# Patient Record
Sex: Female | Born: 1945
Health system: Southern US, Community
[De-identification: ages and names within clinical notes are randomized; demographics above are authoritative.]

## PROBLEM LIST (undated history)

## (undated) DIAGNOSIS — I10 Essential (primary) hypertension: Secondary | ICD-10-CM

## (undated) DIAGNOSIS — N819 Female genital prolapse, unspecified: Secondary | ICD-10-CM

## (undated) DIAGNOSIS — Z87442 Personal history of urinary calculi: Secondary | ICD-10-CM

## (undated) DIAGNOSIS — E7800 Pure hypercholesterolemia, unspecified: Secondary | ICD-10-CM

## (undated) DIAGNOSIS — N201 Calculus of ureter: Secondary | ICD-10-CM

## (undated) DIAGNOSIS — E119 Type 2 diabetes mellitus without complications: Secondary | ICD-10-CM

## (undated) DIAGNOSIS — J309 Allergic rhinitis, unspecified: Secondary | ICD-10-CM

## (undated) HISTORY — DX: Type 2 diabetes mellitus without complications: E11.9

## (undated) HISTORY — PX: TONSILLECTOMY: SUR1361

## (undated) HISTORY — PX: TUBAL LIGATION: SHX77

## (undated) HISTORY — DX: Allergic rhinitis, unspecified: J30.9

## (undated) HISTORY — DX: Pure hypercholesterolemia, unspecified: E78.00

## (undated) HISTORY — PX: CATARACT EXTRACTION: SUR2

## (undated) HISTORY — DX: Essential (primary) hypertension: I10

## (undated) HISTORY — PX: ADENOIDECTOMY: SUR15

---

## 1999-09-13 ENCOUNTER — Encounter: Payer: Self-pay | Admitting: Family Medicine

## 1999-09-13 ENCOUNTER — Encounter: Admission: RE | Admit: 1999-09-13 | Discharge: 1999-09-13 | Payer: Self-pay | Admitting: Family Medicine

## 2000-11-28 ENCOUNTER — Encounter: Payer: Self-pay | Admitting: Family Medicine

## 2000-11-28 ENCOUNTER — Encounter: Admission: RE | Admit: 2000-11-28 | Discharge: 2000-11-28 | Payer: Self-pay | Admitting: Family Medicine

## 2002-01-12 ENCOUNTER — Encounter: Admission: RE | Admit: 2002-01-12 | Discharge: 2002-01-12 | Payer: Self-pay | Admitting: Family Medicine

## 2002-01-12 ENCOUNTER — Encounter: Payer: Self-pay | Admitting: Family Medicine

## 2003-01-14 ENCOUNTER — Encounter: Admission: RE | Admit: 2003-01-14 | Discharge: 2003-01-14 | Payer: Self-pay | Admitting: Family Medicine

## 2003-01-20 ENCOUNTER — Other Ambulatory Visit: Admission: RE | Admit: 2003-01-20 | Discharge: 2003-01-20 | Payer: Self-pay | Admitting: Family Medicine

## 2003-04-21 ENCOUNTER — Encounter (INDEPENDENT_AMBULATORY_CARE_PROVIDER_SITE_OTHER): Payer: Self-pay | Admitting: Specialist

## 2003-04-21 ENCOUNTER — Ambulatory Visit (HOSPITAL_COMMUNITY): Admission: RE | Admit: 2003-04-21 | Discharge: 2003-04-21 | Payer: Self-pay | Admitting: Gastroenterology

## 2004-01-20 ENCOUNTER — Ambulatory Visit (HOSPITAL_COMMUNITY): Admission: RE | Admit: 2004-01-20 | Discharge: 2004-01-20 | Payer: Self-pay | Admitting: Family Medicine

## 2004-05-23 ENCOUNTER — Other Ambulatory Visit: Admission: RE | Admit: 2004-05-23 | Discharge: 2004-05-23 | Payer: Self-pay | Admitting: Family Medicine

## 2005-03-14 ENCOUNTER — Ambulatory Visit (HOSPITAL_COMMUNITY): Admission: RE | Admit: 2005-03-14 | Discharge: 2005-03-14 | Payer: Self-pay | Admitting: Family Medicine

## 2006-07-25 ENCOUNTER — Ambulatory Visit (HOSPITAL_COMMUNITY): Admission: RE | Admit: 2006-07-25 | Discharge: 2006-07-25 | Payer: Self-pay | Admitting: Family Medicine

## 2006-09-03 ENCOUNTER — Other Ambulatory Visit: Admission: RE | Admit: 2006-09-03 | Discharge: 2006-09-03 | Payer: Self-pay | Admitting: Family Medicine

## 2007-08-27 ENCOUNTER — Ambulatory Visit (HOSPITAL_COMMUNITY): Admission: RE | Admit: 2007-08-27 | Discharge: 2007-08-27 | Payer: Self-pay | Admitting: Family Medicine

## 2007-10-02 ENCOUNTER — Other Ambulatory Visit: Admission: RE | Admit: 2007-10-02 | Discharge: 2007-10-02 | Payer: Self-pay | Admitting: Family Medicine

## 2008-05-16 ENCOUNTER — Emergency Department (HOSPITAL_COMMUNITY): Admission: EM | Admit: 2008-05-16 | Discharge: 2008-05-16 | Payer: Self-pay | Admitting: Emergency Medicine

## 2009-02-02 ENCOUNTER — Ambulatory Visit (HOSPITAL_COMMUNITY): Admission: RE | Admit: 2009-02-02 | Discharge: 2009-02-02 | Payer: Self-pay | Admitting: Family Medicine

## 2009-02-18 ENCOUNTER — Other Ambulatory Visit: Admission: RE | Admit: 2009-02-18 | Discharge: 2009-02-18 | Payer: Self-pay | Admitting: Family Medicine

## 2010-04-12 ENCOUNTER — Ambulatory Visit (INDEPENDENT_AMBULATORY_CARE_PROVIDER_SITE_OTHER): Payer: 59

## 2010-04-12 ENCOUNTER — Inpatient Hospital Stay (INDEPENDENT_AMBULATORY_CARE_PROVIDER_SITE_OTHER)
Admission: RE | Admit: 2010-04-12 | Discharge: 2010-04-12 | Disposition: A | Discharge status: Home / Self Care | Payer: 59 | Source: Ambulatory Visit | Attending: Emergency Medicine | Admitting: Emergency Medicine

## 2010-04-12 DIAGNOSIS — S42309A Unspecified fracture of shaft of humerus, unspecified arm, initial encounter for closed fracture: Secondary | ICD-10-CM

## 2010-04-12 DIAGNOSIS — S42209A Unspecified fracture of upper end of unspecified humerus, initial encounter for closed fracture: Secondary | ICD-10-CM

## 2010-06-14 ENCOUNTER — Other Ambulatory Visit: Payer: Self-pay | Admitting: Family Medicine

## 2010-06-14 ENCOUNTER — Other Ambulatory Visit (HOSPITAL_COMMUNITY)
Admission: RE | Admit: 2010-06-14 | Discharge: 2010-06-14 | Disposition: A | Discharge status: Home / Self Care | Payer: 59 | Source: Ambulatory Visit | Attending: Family Medicine | Admitting: Family Medicine

## 2010-06-14 DIAGNOSIS — Z1159 Encounter for screening for other viral diseases: Secondary | ICD-10-CM | POA: Insufficient documentation

## 2010-06-14 DIAGNOSIS — Z124 Encounter for screening for malignant neoplasm of cervix: Secondary | ICD-10-CM | POA: Insufficient documentation

## 2010-06-15 LAB — URINE MICROSCOPIC-ADD ON

## 2010-06-15 LAB — URINALYSIS, ROUTINE W REFLEX MICROSCOPIC
Leukocytes, UA: NEGATIVE
Nitrite: NEGATIVE
Specific Gravity, Urine: 1.03 (ref 1.005–1.030)
Urobilinogen, UA: 0.2 mg/dL (ref 0.0–1.0)
pH: 5.5 (ref 5.0–8.0)

## 2010-07-21 NOTE — Op Note (Signed)
[name], [name] NO.:  [id number]   MEDICAL RECORD NO.:  [id number]                   PATIENT TYPE:  AMB   LOCATION:  ENDO                                 FACILITY:  [facility]   PHYSICIAN:  Graylin Shiver, M.D.                DATE OF BIRTH:  09/11/1945   DATE OF PROCEDURE:  04/21/2003  DATE OF DISCHARGE:                                 OPERATIVE REPORT   PROCEDURE PERFORMED:  Colonoscopy with biopsy.   INDICATIONS FOR PROCEDURE:  Screening.   Informed consent was obtained after explanation of the risks of bleeding,  infection, and perforation.   PREMEDICATIONS:  Fentanyl 60 mcg  IV, Versed 6 mg IV.   DESCRIPTION OF PROCEDURE:  With the patient in the left lateral decubitus  position, a rectal exam was performed and no masses were felt.  The Olympus  colonoscope was inserted into the rectum and advanced around the colon to  the cecum.  Cecal landmarks were identified.  The cecum and ascending colon  were normal.  In the mid transverse colon there was a small 3 mm sessile  polyp biopsied off with cold forceps.  The descending colon and sigmoid  revealed some scattered diverticula.  In the distal rectum there was a small  3 to 4 mm sessile polyp removed with cold forceps.  The patient tolerated  the procedure well without complications.   IMPRESSION:  1. Colon polyps, 211.3.  2. Diverticulosis.                                               Graylin Shiver, M.D.    SFG/MEDQ  D:  04/21/2003  T:  04/21/2003  Job:  [id number]   cc:   Caryn Bee L. Little, M.D.  [address]  [place]  [place] [address]  Fax: [phone]

## 2010-11-09 ENCOUNTER — Other Ambulatory Visit (HOSPITAL_COMMUNITY): Payer: Self-pay | Admitting: Family Medicine

## 2010-11-09 DIAGNOSIS — Z1231 Encounter for screening mammogram for malignant neoplasm of breast: Secondary | ICD-10-CM

## 2010-11-17 ENCOUNTER — Other Ambulatory Visit: Payer: Self-pay | Admitting: Gastroenterology

## 2010-11-23 ENCOUNTER — Ambulatory Visit (HOSPITAL_COMMUNITY)
Admission: RE | Admit: 2010-11-23 | Discharge: 2010-11-23 | Disposition: A | Discharge status: Home / Self Care | Payer: 59 | Source: Ambulatory Visit | Attending: Family Medicine | Admitting: Family Medicine

## 2010-11-23 DIAGNOSIS — Z1231 Encounter for screening mammogram for malignant neoplasm of breast: Secondary | ICD-10-CM | POA: Insufficient documentation

## 2011-10-23 ENCOUNTER — Other Ambulatory Visit: Payer: Self-pay | Admitting: Family Medicine

## 2011-10-23 DIAGNOSIS — Z1231 Encounter for screening mammogram for malignant neoplasm of breast: Secondary | ICD-10-CM

## 2011-12-06 ENCOUNTER — Ambulatory Visit
Admission: RE | Admit: 2011-12-06 | Discharge: 2011-12-06 | Disposition: A | Discharge status: Home / Self Care | Payer: Medicare Other | Source: Ambulatory Visit | Attending: Family Medicine | Admitting: Family Medicine

## 2011-12-06 DIAGNOSIS — Z1231 Encounter for screening mammogram for malignant neoplasm of breast: Secondary | ICD-10-CM

## 2011-12-19 ENCOUNTER — Other Ambulatory Visit (HOSPITAL_COMMUNITY)
Admission: RE | Admit: 2011-12-19 | Discharge: 2011-12-19 | Disposition: A | Discharge status: Home / Self Care | Payer: Medicare Other | Source: Ambulatory Visit | Attending: Family Medicine | Admitting: Family Medicine

## 2011-12-19 ENCOUNTER — Other Ambulatory Visit: Payer: Self-pay | Admitting: Family Medicine

## 2011-12-19 DIAGNOSIS — Z124 Encounter for screening for malignant neoplasm of cervix: Secondary | ICD-10-CM | POA: Insufficient documentation

## 2012-11-21 ENCOUNTER — Other Ambulatory Visit: Payer: Self-pay

## 2012-11-21 DIAGNOSIS — Z1231 Encounter for screening mammogram for malignant neoplasm of breast: Secondary | ICD-10-CM

## 2012-12-15 ENCOUNTER — Ambulatory Visit
Admission: RE | Admit: 2012-12-15 | Discharge: 2012-12-15 | Disposition: A | Discharge status: Home / Self Care | Payer: Medicare Other | Source: Ambulatory Visit

## 2012-12-15 DIAGNOSIS — Z1231 Encounter for screening mammogram for malignant neoplasm of breast: Secondary | ICD-10-CM

## 2014-01-15 ENCOUNTER — Other Ambulatory Visit: Payer: Self-pay

## 2014-01-15 DIAGNOSIS — Z1231 Encounter for screening mammogram for malignant neoplasm of breast: Secondary | ICD-10-CM

## 2014-01-27 ENCOUNTER — Ambulatory Visit: Payer: Medicare Other

## 2014-02-01 ENCOUNTER — Ambulatory Visit
Admission: RE | Admit: 2014-02-01 | Discharge: 2014-02-01 | Disposition: A | Discharge status: Home / Self Care | Payer: Medicare Other | Source: Ambulatory Visit

## 2014-02-01 DIAGNOSIS — Z1231 Encounter for screening mammogram for malignant neoplasm of breast: Secondary | ICD-10-CM

## 2015-01-17 ENCOUNTER — Other Ambulatory Visit: Payer: Self-pay

## 2015-01-17 DIAGNOSIS — Z1231 Encounter for screening mammogram for malignant neoplasm of breast: Secondary | ICD-10-CM

## 2015-02-22 ENCOUNTER — Ambulatory Visit
Admission: RE | Admit: 2015-02-22 | Discharge: 2015-02-22 | Disposition: A | Discharge status: Home / Self Care | Payer: Medicare Other | Source: Ambulatory Visit

## 2015-02-22 DIAGNOSIS — Z1231 Encounter for screening mammogram for malignant neoplasm of breast: Secondary | ICD-10-CM

## 2015-06-13 DIAGNOSIS — L298 Other pruritus: Secondary | ICD-10-CM | POA: Diagnosis not present

## 2015-06-16 DIAGNOSIS — M5416 Radiculopathy, lumbar region: Secondary | ICD-10-CM | POA: Diagnosis not present

## 2015-07-05 DIAGNOSIS — M5489 Other dorsalgia: Secondary | ICD-10-CM | POA: Diagnosis not present

## 2015-07-11 DIAGNOSIS — E1165 Type 2 diabetes mellitus with hyperglycemia: Secondary | ICD-10-CM | POA: Diagnosis not present

## 2015-07-15 DIAGNOSIS — Z794 Long term (current) use of insulin: Secondary | ICD-10-CM | POA: Diagnosis not present

## 2015-07-15 DIAGNOSIS — I10 Essential (primary) hypertension: Secondary | ICD-10-CM | POA: Diagnosis not present

## 2015-07-15 DIAGNOSIS — E1165 Type 2 diabetes mellitus with hyperglycemia: Secondary | ICD-10-CM | POA: Diagnosis not present

## 2015-07-15 DIAGNOSIS — E785 Hyperlipidemia, unspecified: Secondary | ICD-10-CM | POA: Diagnosis not present

## 2015-07-29 DIAGNOSIS — M722 Plantar fascial fibromatosis: Secondary | ICD-10-CM | POA: Diagnosis not present

## 2015-07-29 DIAGNOSIS — M25571 Pain in right ankle and joints of right foot: Secondary | ICD-10-CM | POA: Diagnosis not present

## 2015-07-29 DIAGNOSIS — M65871 Other synovitis and tenosynovitis, right ankle and foot: Secondary | ICD-10-CM | POA: Diagnosis not present

## 2015-07-29 DIAGNOSIS — M7671 Peroneal tendinitis, right leg: Secondary | ICD-10-CM | POA: Diagnosis not present

## 2015-07-29 DIAGNOSIS — M79671 Pain in right foot: Secondary | ICD-10-CM | POA: Diagnosis not present

## 2015-09-13 DIAGNOSIS — S32000S Wedge compression fracture of unspecified lumbar vertebra, sequela: Secondary | ICD-10-CM | POA: Diagnosis not present

## 2015-09-13 DIAGNOSIS — E2839 Other primary ovarian failure: Secondary | ICD-10-CM | POA: Diagnosis not present

## 2015-10-11 DIAGNOSIS — E1165 Type 2 diabetes mellitus with hyperglycemia: Secondary | ICD-10-CM | POA: Diagnosis not present

## 2015-10-14 DIAGNOSIS — M25571 Pain in right ankle and joints of right foot: Secondary | ICD-10-CM | POA: Diagnosis not present

## 2015-10-14 DIAGNOSIS — M65871 Other synovitis and tenosynovitis, right ankle and foot: Secondary | ICD-10-CM | POA: Diagnosis not present

## 2015-10-17 DIAGNOSIS — I10 Essential (primary) hypertension: Secondary | ICD-10-CM | POA: Diagnosis not present

## 2015-10-17 DIAGNOSIS — E785 Hyperlipidemia, unspecified: Secondary | ICD-10-CM | POA: Diagnosis not present

## 2015-10-17 DIAGNOSIS — E1165 Type 2 diabetes mellitus with hyperglycemia: Secondary | ICD-10-CM | POA: Diagnosis not present

## 2015-10-17 DIAGNOSIS — Z794 Long term (current) use of insulin: Secondary | ICD-10-CM | POA: Diagnosis not present

## 2015-11-28 DIAGNOSIS — Z23 Encounter for immunization: Secondary | ICD-10-CM | POA: Diagnosis not present

## 2015-12-16 DIAGNOSIS — J3089 Other allergic rhinitis: Secondary | ICD-10-CM | POA: Diagnosis not present

## 2015-12-16 DIAGNOSIS — M791 Myalgia: Secondary | ICD-10-CM | POA: Diagnosis not present

## 2015-12-20 DIAGNOSIS — M791 Myalgia: Secondary | ICD-10-CM | POA: Diagnosis not present

## 2015-12-20 DIAGNOSIS — E119 Type 2 diabetes mellitus without complications: Secondary | ICD-10-CM | POA: Diagnosis not present

## 2015-12-20 DIAGNOSIS — M25552 Pain in left hip: Secondary | ICD-10-CM | POA: Diagnosis not present

## 2015-12-20 DIAGNOSIS — M6281 Muscle weakness (generalized): Secondary | ICD-10-CM | POA: Diagnosis not present

## 2015-12-22 DIAGNOSIS — M25552 Pain in left hip: Secondary | ICD-10-CM | POA: Diagnosis not present

## 2015-12-22 DIAGNOSIS — M791 Myalgia: Secondary | ICD-10-CM | POA: Diagnosis not present

## 2015-12-22 DIAGNOSIS — E119 Type 2 diabetes mellitus without complications: Secondary | ICD-10-CM | POA: Diagnosis not present

## 2015-12-22 DIAGNOSIS — M6281 Muscle weakness (generalized): Secondary | ICD-10-CM | POA: Diagnosis not present

## 2015-12-27 DIAGNOSIS — M25552 Pain in left hip: Secondary | ICD-10-CM | POA: Diagnosis not present

## 2015-12-27 DIAGNOSIS — E119 Type 2 diabetes mellitus without complications: Secondary | ICD-10-CM | POA: Diagnosis not present

## 2015-12-27 DIAGNOSIS — M6281 Muscle weakness (generalized): Secondary | ICD-10-CM | POA: Diagnosis not present

## 2015-12-27 DIAGNOSIS — M791 Myalgia: Secondary | ICD-10-CM | POA: Diagnosis not present

## 2016-01-05 DIAGNOSIS — E119 Type 2 diabetes mellitus without complications: Secondary | ICD-10-CM | POA: Diagnosis not present

## 2016-01-05 DIAGNOSIS — M6281 Muscle weakness (generalized): Secondary | ICD-10-CM | POA: Diagnosis not present

## 2016-01-05 DIAGNOSIS — M25552 Pain in left hip: Secondary | ICD-10-CM | POA: Diagnosis not present

## 2016-01-05 DIAGNOSIS — M791 Myalgia: Secondary | ICD-10-CM | POA: Diagnosis not present

## 2016-01-16 DIAGNOSIS — M25552 Pain in left hip: Secondary | ICD-10-CM | POA: Diagnosis not present

## 2016-01-16 DIAGNOSIS — E119 Type 2 diabetes mellitus without complications: Secondary | ICD-10-CM | POA: Diagnosis not present

## 2016-01-16 DIAGNOSIS — M791 Myalgia: Secondary | ICD-10-CM | POA: Diagnosis not present

## 2016-01-16 DIAGNOSIS — M6281 Muscle weakness (generalized): Secondary | ICD-10-CM | POA: Diagnosis not present

## 2016-01-19 DIAGNOSIS — E1165 Type 2 diabetes mellitus with hyperglycemia: Secondary | ICD-10-CM | POA: Diagnosis not present

## 2016-01-20 DIAGNOSIS — E119 Type 2 diabetes mellitus without complications: Secondary | ICD-10-CM | POA: Diagnosis not present

## 2016-01-20 DIAGNOSIS — M6281 Muscle weakness (generalized): Secondary | ICD-10-CM | POA: Diagnosis not present

## 2016-01-20 DIAGNOSIS — M791 Myalgia: Secondary | ICD-10-CM | POA: Diagnosis not present

## 2016-01-20 DIAGNOSIS — M25552 Pain in left hip: Secondary | ICD-10-CM | POA: Diagnosis not present

## 2016-01-25 DIAGNOSIS — M25552 Pain in left hip: Secondary | ICD-10-CM | POA: Diagnosis not present

## 2016-01-25 DIAGNOSIS — M791 Myalgia: Secondary | ICD-10-CM | POA: Diagnosis not present

## 2016-01-25 DIAGNOSIS — E119 Type 2 diabetes mellitus without complications: Secondary | ICD-10-CM | POA: Diagnosis not present

## 2016-01-25 DIAGNOSIS — M6281 Muscle weakness (generalized): Secondary | ICD-10-CM | POA: Diagnosis not present

## 2016-01-30 DIAGNOSIS — M6281 Muscle weakness (generalized): Secondary | ICD-10-CM | POA: Diagnosis not present

## 2016-01-30 DIAGNOSIS — E119 Type 2 diabetes mellitus without complications: Secondary | ICD-10-CM | POA: Diagnosis not present

## 2016-01-30 DIAGNOSIS — M791 Myalgia: Secondary | ICD-10-CM | POA: Diagnosis not present

## 2016-01-30 DIAGNOSIS — M25552 Pain in left hip: Secondary | ICD-10-CM | POA: Diagnosis not present

## 2016-01-31 DIAGNOSIS — I10 Essential (primary) hypertension: Secondary | ICD-10-CM | POA: Diagnosis not present

## 2016-01-31 DIAGNOSIS — E785 Hyperlipidemia, unspecified: Secondary | ICD-10-CM | POA: Diagnosis not present

## 2016-01-31 DIAGNOSIS — E1165 Type 2 diabetes mellitus with hyperglycemia: Secondary | ICD-10-CM | POA: Diagnosis not present

## 2016-01-31 DIAGNOSIS — Z794 Long term (current) use of insulin: Secondary | ICD-10-CM | POA: Diagnosis not present

## 2016-02-02 DIAGNOSIS — M67449 Ganglion, unspecified hand: Secondary | ICD-10-CM | POA: Diagnosis not present

## 2016-02-15 ENCOUNTER — Other Ambulatory Visit: Payer: Self-pay | Admitting: Family Medicine

## 2016-02-15 DIAGNOSIS — Z1231 Encounter for screening mammogram for malignant neoplasm of breast: Secondary | ICD-10-CM

## 2016-03-14 ENCOUNTER — Ambulatory Visit (INDEPENDENT_AMBULATORY_CARE_PROVIDER_SITE_OTHER): Payer: Medicare Other | Admitting: Allergy and Immunology

## 2016-03-14 ENCOUNTER — Ambulatory Visit
Admission: RE | Admit: 2016-03-14 | Discharge: 2016-03-14 | Disposition: A | Discharge status: Home / Self Care | Payer: Medicare Other | Source: Ambulatory Visit | Attending: Family Medicine | Admitting: Family Medicine

## 2016-03-14 ENCOUNTER — Encounter: Payer: Self-pay | Admitting: Allergy and Immunology

## 2016-03-14 VITALS — BP 142/86 | HR 72 | Temp 98.2°F | Resp 18 | Ht 63.5 in | Wt 199.4 lb

## 2016-03-14 DIAGNOSIS — J3089 Other allergic rhinitis: Secondary | ICD-10-CM

## 2016-03-14 DIAGNOSIS — Z1231 Encounter for screening mammogram for malignant neoplasm of breast: Secondary | ICD-10-CM

## 2016-03-14 MED ORDER — MONTELUKAST SODIUM 10 MG PO TABS: ORAL_TABLET | ORAL | 5 refills | Status: DC

## 2016-03-14 MED ORDER — BEPOTASTINE BESILATE 1.5 % OP SOLN: OPHTHALMIC | 5 refills | Status: AC

## 2016-03-14 NOTE — Patient Instructions (Addendum)
  1. Allergen avoidance measures  2. Treat and prevent inflammation:   A. continue OTC Flonase 1-2 sprays each nostril one time per day  B. start montelukast 10 mg tablet 1 time per day  3. If needed:   A. Bepreve - one drop each eye twice a day  B. nasal saline  4. Eliminate use of all antihistamines including cetirizine/Zyrtec  5. Further evaluation and treatment? Immunotherapy?  6. Return to clinic in 4 weeks or earlier if problem  7. Visit with ophthalmologist for eye exam

## 2016-03-14 NOTE — Progress Notes (Signed)
Dear Dr. Clarene DukeLittle,  Thank you [facility] referring [name].   Below is a summation of this patient's evaluation and recommendations.  Thank you for your referral. I will keep you informed about this patient's response to treatment.   If you have any questions please do not hesitate to contact me.   Sincerely,  Jessica PriestEric J. Kozlow, MD [facility]   ______________________________________________________________________    NEW PATIENT NOTE  Referring Provider: Catha GosselinLittle, Kevin, MD Primary Provider: Mickie HillierLITTLE,KEVIN LORNE, MD Date of office visit: 03/14/2016    Subjective:   Chief Complaint:  Sally [name] (DOB: 10-18-1945) is a 71 y.o. female who presents to the clinic on 03/14/2016 with a chief complaint of Allergic Rhinitis  .     HPI: Sally [name] presents to this clinic in evaluation of allergic disease of long-standing nature. Apparently she has issues with nasal congestion and some occasional rhinorrhea and some rare sneezing as well as watery eyes that has been a progressive issue over the course of the past several years. Initially this would only occur during the fall to winter season but now has expanded into other seasons. There is no obvious provoking factor giving rise to these symptoms. She does not have any associated anosmia or ugly nasal discharge or headache. She apparently did see an ophthalmologist a year ago who told her that she did not have dry eye.  She did have a history of fall time shortness of breath that was relatively transient. Apparently she was diagnosed with asthma at one point many years ago and was treated for asthma for a year but for the most part all of her lower respiratory tract symptoms have abated. When she goes outside and works in the fall with moldy leaves she will use a mask and eyeglasses which results in elimination of her fall  time shortness of breath.  Past Medical History:  Diagnosis Date  . Allergic rhinitis   . Diabetes (HCC)   . High cholesterol     Past Surgical History:  Procedure Laterality Date  . CATARACT EXTRACTION Bilateral 2016-2017    Allergies as of 03/14/2016      Reactions   Codeine Shortness Of Breath   Lipitor [atorvastatin] Other (See Comments)   Muscle pain   Zocor [simvastatin] Other (See Comments)   Muscle pain      Medication List      aspirin EC 81 MG tablet Take 81 mg by mouth daily.   cetirizine 10 MG tablet Commonly known as:  ZYRTEC Take 10 mg by mouth daily.   fluticasone 50 MCG/ACT nasal spray Commonly known as:  FLONASE Place 2 sprays into both nostrils daily.   GLUCOSAMINE 1500 COMPLEX PO Take by mouth.   HUMALOG KWIKPEN 100 UNIT/ML KiwkPen Generic drug:  insulin lispro INJ 1 UNIT [place] PER 10  GRAMS OF CARBS AT MEALS PLUS 1 EXTRA UNIT FOR EVERY 20 MG/DL ABOVE 161100 MG/DL. MAX OF 40 UNITS PER DAY   ibuprofen 800 MG tablet Commonly known as:  ADVIL,MOTRIN   INVOKAMET [id number] MG Tabs Generic drug:  Canagliflozin-Metformin HCl   LANTUS SOLOSTAR 100 UNIT/ML Solostar Pen Generic drug:  Insulin Glargine   Melatonin 5 MG Caps Take 5 mg by mouth at bedtime.   rosuvastatin 10 MG tablet Commonly known as:  CRESTOR   Vitamin D3 2000 units Tabs Take by mouth.  Review of systems negative except as noted in HPI / PMHx or noted below:  Review of Systems  Constitutional: Negative.   HENT: Negative.   Eyes: Negative.   Respiratory: Negative.   Cardiovascular: Negative.   Gastrointestinal: Negative.   Genitourinary: Negative.   Musculoskeletal: Negative.   Skin: Negative.   Neurological: Negative.   Endo/Heme/Allergies: Negative.   Psychiatric/Behavioral: Negative.     Family History  Problem Relation Age of Onset  . Cancer Mother   . Heart attack Father   . Allergic rhinitis Neg Hx   . Asthma Neg Hx     Social History   Social  History  . Marital status: Married    Spouse name: N/A  . Number of children: N/A  . Years of education: N/A   Occupational History  . Not on file.   Social History Main Topics  . Smoking status: Never Smoker  . Smokeless tobacco: Never Used  . Alcohol use Not on file  . Drug use: Unknown  . Sexual activity: Not on file   Other Topics Concern  . Not on file   Social History Narrative  . No narrative on file    Environmental and Social history  Lives in a house with a dry environment, no animals located inside the household, carpeting in the bedroom, no plastic on the bed or pillow, and no smoking ongoing with inside the household.  Objective:   Vitals:   03/14/16 0910  BP: (!) 142/86  Pulse: 72  Resp: 18  Temp: 98.2 F (36.8 C)   Height: 5' 3.5" (161.3 cm) Weight: 199 lb 6.4 oz (90.4 kg)  Physical Exam  Constitutional: She is well-developed, well-nourished, and in no distress.  HENT:  Head: Normocephalic. Head is without right periorbital erythema and without left periorbital erythema.  Right Ear: Tympanic membrane, external ear and ear canal normal.  Left Ear: Tympanic membrane, external ear and ear canal normal.  Nose: Nose normal. No mucosal edema or rhinorrhea.  Mouth/Throat: Oropharynx is clear and moist and mucous membranes are normal. No oropharyngeal exudate.  Eyes: Conjunctivae and lids are normal. Pupils are equal, round, and reactive to light.  Neck: Trachea normal. No tracheal deviation present. No thyromegaly present.  Cardiovascular: Normal rate, regular rhythm, S1 normal, S2 normal and normal heart sounds.   No murmur heard. Pulmonary/Chest: Effort normal. No stridor. No tachypnea. No respiratory distress. She has no wheezes. She has no rales. She exhibits no tenderness.  Abdominal: Soft. She exhibits no distension and no mass. There is no hepatosplenomegaly. There is no tenderness. There is no rebound and no guarding.  Musculoskeletal: She  exhibits no edema or tenderness.  Lymphadenopathy:       Head (right side): No tonsillar adenopathy present.       Head (left side): No tonsillar adenopathy present.    She has no cervical adenopathy.    She has no axillary adenopathy.  Neurological: She is alert. Gait normal.  Skin: No rash noted. She is not diaphoretic. No erythema. No pallor. Nails show no clubbing.  Psychiatric: Mood and affect normal.    Diagnostics: Allergy skin tests were performed. She demonstrated hypersensitivity to house dust mite and trees and mold  Assessment and Plan:    1. Other allergic rhinitis     1. Allergen avoidance measures  2. Treat and prevent inflammation:   A. continue OTC Flonase 1-2 sprays each nostril one time per day  B. start montelukast 10 mg tablet 1 time per  day  3. If needed:   A. Bepreve - one drop each eye twice a day  B. nasal saline  4. Eliminate use of all antihistamines including cetirizine/Zyrtec  5. Further evaluation and treatment? Immunotherapy?  6. Return to clinic in 4 weeks or earlier if problem  7. Visit with ophthalmologist for eye exam  Sally [name] should do much better utilizing a plan with a combination of allergen avoidance measures and anti-inflammatory medications for her respiratory tract. I think there may also be a component of dry eye and I've asked her to revisit with her ophthalmologist concerning this issue and I've asked her not to use any antihistamines at this point. I'll regroup with her in approximately 4 weeks or earlier if there is a problem. If she still continues to have problems she would be a candidate for immunotherapy.  Jessica Priest, MD [facility]

## 2016-03-26 DIAGNOSIS — Z961 Presence of intraocular lens: Secondary | ICD-10-CM | POA: Diagnosis not present

## 2016-03-29 DIAGNOSIS — E785 Hyperlipidemia, unspecified: Secondary | ICD-10-CM | POA: Diagnosis not present

## 2016-03-29 DIAGNOSIS — E119 Type 2 diabetes mellitus without complications: Secondary | ICD-10-CM | POA: Diagnosis not present

## 2016-03-29 DIAGNOSIS — N920 Excessive and frequent menstruation with regular cycle: Secondary | ICD-10-CM | POA: Diagnosis not present

## 2016-03-29 DIAGNOSIS — Z86010 Personal history of colonic polyps: Secondary | ICD-10-CM | POA: Diagnosis not present

## 2016-03-29 DIAGNOSIS — I10 Essential (primary) hypertension: Secondary | ICD-10-CM | POA: Diagnosis not present

## 2016-03-29 DIAGNOSIS — J3089 Other allergic rhinitis: Secondary | ICD-10-CM | POA: Diagnosis not present

## 2016-03-29 DIAGNOSIS — Z0000 Encounter for general adult medical examination without abnormal findings: Secondary | ICD-10-CM | POA: Diagnosis not present

## 2016-04-26 DIAGNOSIS — N898 Other specified noninflammatory disorders of vagina: Secondary | ICD-10-CM | POA: Diagnosis not present

## 2016-04-26 DIAGNOSIS — N950 Postmenopausal bleeding: Secondary | ICD-10-CM | POA: Diagnosis not present

## 2016-04-26 DIAGNOSIS — N952 Postmenopausal atrophic vaginitis: Secondary | ICD-10-CM | POA: Diagnosis not present

## 2016-04-30 DIAGNOSIS — N950 Postmenopausal bleeding: Secondary | ICD-10-CM | POA: Diagnosis not present

## 2016-05-03 DIAGNOSIS — E1165 Type 2 diabetes mellitus with hyperglycemia: Secondary | ICD-10-CM | POA: Diagnosis not present

## 2016-06-22 DIAGNOSIS — E785 Hyperlipidemia, unspecified: Secondary | ICD-10-CM | POA: Diagnosis not present

## 2016-06-22 DIAGNOSIS — Z794 Long term (current) use of insulin: Secondary | ICD-10-CM | POA: Diagnosis not present

## 2016-06-22 DIAGNOSIS — E1165 Type 2 diabetes mellitus with hyperglycemia: Secondary | ICD-10-CM | POA: Diagnosis not present

## 2016-06-22 DIAGNOSIS — I10 Essential (primary) hypertension: Secondary | ICD-10-CM | POA: Diagnosis not present

## 2016-08-15 DIAGNOSIS — E1165 Type 2 diabetes mellitus with hyperglycemia: Secondary | ICD-10-CM | POA: Diagnosis not present

## 2016-08-16 DIAGNOSIS — E1165 Type 2 diabetes mellitus with hyperglycemia: Secondary | ICD-10-CM | POA: Diagnosis not present

## 2016-09-07 DIAGNOSIS — Z794 Long term (current) use of insulin: Secondary | ICD-10-CM | POA: Diagnosis not present

## 2016-09-07 DIAGNOSIS — E1165 Type 2 diabetes mellitus with hyperglycemia: Secondary | ICD-10-CM | POA: Diagnosis not present

## 2016-09-07 DIAGNOSIS — I10 Essential (primary) hypertension: Secondary | ICD-10-CM | POA: Diagnosis not present

## 2016-09-07 DIAGNOSIS — E785 Hyperlipidemia, unspecified: Secondary | ICD-10-CM | POA: Diagnosis not present

## 2016-11-09 DIAGNOSIS — E1165 Type 2 diabetes mellitus with hyperglycemia: Secondary | ICD-10-CM | POA: Diagnosis not present

## 2016-11-29 DIAGNOSIS — Z23 Encounter for immunization: Secondary | ICD-10-CM | POA: Diagnosis not present

## 2017-02-04 DIAGNOSIS — E1165 Type 2 diabetes mellitus with hyperglycemia: Secondary | ICD-10-CM | POA: Diagnosis not present

## 2017-03-11 DIAGNOSIS — E785 Hyperlipidemia, unspecified: Secondary | ICD-10-CM | POA: Diagnosis not present

## 2017-03-11 DIAGNOSIS — E1165 Type 2 diabetes mellitus with hyperglycemia: Secondary | ICD-10-CM | POA: Diagnosis not present

## 2017-03-11 DIAGNOSIS — I10 Essential (primary) hypertension: Secondary | ICD-10-CM | POA: Diagnosis not present

## 2017-03-11 DIAGNOSIS — Z794 Long term (current) use of insulin: Secondary | ICD-10-CM | POA: Diagnosis not present

## 2017-03-13 ENCOUNTER — Other Ambulatory Visit: Payer: Self-pay | Admitting: Obstetrics & Gynecology

## 2017-03-13 DIAGNOSIS — L280 Lichen simplex chronicus: Secondary | ICD-10-CM | POA: Diagnosis not present

## 2017-03-13 DIAGNOSIS — N904 Leukoplakia of vulva: Secondary | ICD-10-CM | POA: Diagnosis not present

## 2017-03-15 DIAGNOSIS — E538 Deficiency of other specified B group vitamins: Secondary | ICD-10-CM | POA: Diagnosis not present

## 2017-03-20 DIAGNOSIS — J0100 Acute maxillary sinusitis, unspecified: Secondary | ICD-10-CM | POA: Diagnosis not present

## 2017-03-22 DIAGNOSIS — E538 Deficiency of other specified B group vitamins: Secondary | ICD-10-CM | POA: Diagnosis not present

## 2017-03-29 DIAGNOSIS — E538 Deficiency of other specified B group vitamins: Secondary | ICD-10-CM | POA: Diagnosis not present

## 2017-04-05 DIAGNOSIS — E538 Deficiency of other specified B group vitamins: Secondary | ICD-10-CM | POA: Diagnosis not present

## 2017-05-07 ENCOUNTER — Other Ambulatory Visit: Payer: Self-pay | Admitting: Family Medicine

## 2017-05-07 DIAGNOSIS — Z1231 Encounter for screening mammogram for malignant neoplasm of breast: Secondary | ICD-10-CM

## 2017-05-10 DIAGNOSIS — E538 Deficiency of other specified B group vitamins: Secondary | ICD-10-CM | POA: Diagnosis not present

## 2017-05-13 DIAGNOSIS — H526 Other disorders of refraction: Secondary | ICD-10-CM | POA: Diagnosis not present

## 2017-05-13 DIAGNOSIS — E1165 Type 2 diabetes mellitus with hyperglycemia: Secondary | ICD-10-CM | POA: Diagnosis not present

## 2017-05-29 ENCOUNTER — Ambulatory Visit
Admission: RE | Admit: 2017-05-29 | Discharge: 2017-05-29 | Disposition: A | Discharge status: Home / Self Care | Payer: Medicare Other | Source: Ambulatory Visit | Attending: Family Medicine | Admitting: Family Medicine

## 2017-05-29 DIAGNOSIS — Z1231 Encounter for screening mammogram for malignant neoplasm of breast: Secondary | ICD-10-CM

## 2017-06-12 DIAGNOSIS — Z0000 Encounter for general adult medical examination without abnormal findings: Secondary | ICD-10-CM | POA: Diagnosis not present

## 2017-06-12 DIAGNOSIS — E538 Deficiency of other specified B group vitamins: Secondary | ICD-10-CM | POA: Diagnosis not present

## 2017-06-12 DIAGNOSIS — I10 Essential (primary) hypertension: Secondary | ICD-10-CM | POA: Diagnosis not present

## 2017-06-12 DIAGNOSIS — E7800 Pure hypercholesterolemia, unspecified: Secondary | ICD-10-CM | POA: Diagnosis not present

## 2017-07-11 DIAGNOSIS — L309 Dermatitis, unspecified: Secondary | ICD-10-CM | POA: Diagnosis not present

## 2017-08-14 DIAGNOSIS — E1165 Type 2 diabetes mellitus with hyperglycemia: Secondary | ICD-10-CM | POA: Diagnosis not present

## 2017-08-22 DIAGNOSIS — R21 Rash and other nonspecific skin eruption: Secondary | ICD-10-CM | POA: Diagnosis not present

## 2017-08-22 DIAGNOSIS — E538 Deficiency of other specified B group vitamins: Secondary | ICD-10-CM | POA: Diagnosis not present

## 2017-09-10 DIAGNOSIS — I10 Essential (primary) hypertension: Secondary | ICD-10-CM | POA: Diagnosis not present

## 2017-09-10 DIAGNOSIS — Z794 Long term (current) use of insulin: Secondary | ICD-10-CM | POA: Diagnosis not present

## 2017-09-10 DIAGNOSIS — E785 Hyperlipidemia, unspecified: Secondary | ICD-10-CM | POA: Diagnosis not present

## 2017-09-10 DIAGNOSIS — E1165 Type 2 diabetes mellitus with hyperglycemia: Secondary | ICD-10-CM | POA: Diagnosis not present

## 2017-11-19 DIAGNOSIS — E1165 Type 2 diabetes mellitus with hyperglycemia: Secondary | ICD-10-CM | POA: Diagnosis not present

## 2017-11-20 DIAGNOSIS — R3915 Urgency of urination: Secondary | ICD-10-CM | POA: Diagnosis not present

## 2017-11-20 DIAGNOSIS — J0100 Acute maxillary sinusitis, unspecified: Secondary | ICD-10-CM | POA: Diagnosis not present

## 2017-12-04 DIAGNOSIS — Z23 Encounter for immunization: Secondary | ICD-10-CM | POA: Diagnosis not present

## 2017-12-11 DIAGNOSIS — H16141 Punctate keratitis, right eye: Secondary | ICD-10-CM | POA: Diagnosis not present

## 2018-03-02 DIAGNOSIS — E1165 Type 2 diabetes mellitus with hyperglycemia: Secondary | ICD-10-CM | POA: Diagnosis not present

## 2018-03-05 DIAGNOSIS — N201 Calculus of ureter: Secondary | ICD-10-CM

## 2018-03-05 HISTORY — PX: OTHER SURGICAL HISTORY: SHX169

## 2018-03-05 HISTORY — DX: Calculus of ureter: N20.1

## 2018-03-13 DIAGNOSIS — Z794 Long term (current) use of insulin: Secondary | ICD-10-CM | POA: Diagnosis not present

## 2018-03-13 DIAGNOSIS — E1165 Type 2 diabetes mellitus with hyperglycemia: Secondary | ICD-10-CM | POA: Diagnosis not present

## 2018-03-13 DIAGNOSIS — I10 Essential (primary) hypertension: Secondary | ICD-10-CM | POA: Diagnosis not present

## 2018-03-13 DIAGNOSIS — E785 Hyperlipidemia, unspecified: Secondary | ICD-10-CM | POA: Diagnosis not present

## 2018-04-07 DIAGNOSIS — D123 Benign neoplasm of transverse colon: Secondary | ICD-10-CM | POA: Diagnosis not present

## 2018-04-07 DIAGNOSIS — D122 Benign neoplasm of ascending colon: Secondary | ICD-10-CM | POA: Diagnosis not present

## 2018-04-07 DIAGNOSIS — Z86010 Personal history of colonic polyps: Secondary | ICD-10-CM | POA: Diagnosis not present

## 2018-04-07 DIAGNOSIS — K5730 Diverticulosis of large intestine without perforation or abscess without bleeding: Secondary | ICD-10-CM | POA: Diagnosis not present

## 2018-04-09 DIAGNOSIS — D122 Benign neoplasm of ascending colon: Secondary | ICD-10-CM | POA: Diagnosis not present

## 2018-04-09 DIAGNOSIS — D123 Benign neoplasm of transverse colon: Secondary | ICD-10-CM | POA: Diagnosis not present

## 2018-04-28 ENCOUNTER — Other Ambulatory Visit: Payer: Self-pay | Admitting: Family Medicine

## 2018-04-28 DIAGNOSIS — Z1231 Encounter for screening mammogram for malignant neoplasm of breast: Secondary | ICD-10-CM

## 2018-05-13 DIAGNOSIS — D2361 Other benign neoplasm of skin of right upper limb, including shoulder: Secondary | ICD-10-CM | POA: Diagnosis not present

## 2018-05-13 DIAGNOSIS — L718 Other rosacea: Secondary | ICD-10-CM | POA: Diagnosis not present

## 2018-05-13 DIAGNOSIS — D2362 Other benign neoplasm of skin of left upper limb, including shoulder: Secondary | ICD-10-CM | POA: Diagnosis not present

## 2018-05-13 DIAGNOSIS — L821 Other seborrheic keratosis: Secondary | ICD-10-CM | POA: Diagnosis not present

## 2018-05-14 DIAGNOSIS — H526 Other disorders of refraction: Secondary | ICD-10-CM | POA: Diagnosis not present

## 2018-05-20 DIAGNOSIS — Z0120 Encounter for dental examination and cleaning without abnormal findings: Secondary | ICD-10-CM | POA: Diagnosis not present

## 2018-06-05 DIAGNOSIS — E1165 Type 2 diabetes mellitus with hyperglycemia: Secondary | ICD-10-CM | POA: Diagnosis not present

## 2018-06-11 ENCOUNTER — Ambulatory Visit: Payer: Medicare Other

## 2018-06-20 DIAGNOSIS — M25572 Pain in left ankle and joints of left foot: Secondary | ICD-10-CM | POA: Diagnosis not present

## 2018-07-17 ENCOUNTER — Ambulatory Visit
Admission: RE | Admit: 2018-07-17 | Discharge: 2018-07-17 | Disposition: A | Discharge status: Home / Self Care | Payer: Medicare Other | Source: Ambulatory Visit | Attending: Family Medicine | Admitting: Family Medicine

## 2018-07-17 ENCOUNTER — Other Ambulatory Visit: Payer: Self-pay

## 2018-07-17 DIAGNOSIS — Z1231 Encounter for screening mammogram for malignant neoplasm of breast: Secondary | ICD-10-CM | POA: Diagnosis not present

## 2018-07-23 ENCOUNTER — Ambulatory Visit: Payer: Medicare Other

## 2018-07-24 DIAGNOSIS — E1165 Type 2 diabetes mellitus with hyperglycemia: Secondary | ICD-10-CM | POA: Diagnosis not present

## 2018-07-24 DIAGNOSIS — I10 Essential (primary) hypertension: Secondary | ICD-10-CM | POA: Diagnosis not present

## 2018-07-24 DIAGNOSIS — Z13220 Encounter for screening for lipoid disorders: Secondary | ICD-10-CM | POA: Diagnosis not present

## 2018-07-24 DIAGNOSIS — E538 Deficiency of other specified B group vitamins: Secondary | ICD-10-CM | POA: Diagnosis not present

## 2018-07-31 DIAGNOSIS — R05 Cough: Secondary | ICD-10-CM | POA: Diagnosis not present

## 2018-07-31 DIAGNOSIS — J3089 Other allergic rhinitis: Secondary | ICD-10-CM | POA: Diagnosis not present

## 2018-08-20 DIAGNOSIS — E1165 Type 2 diabetes mellitus with hyperglycemia: Secondary | ICD-10-CM | POA: Diagnosis not present

## 2018-08-20 DIAGNOSIS — E7800 Pure hypercholesterolemia, unspecified: Secondary | ICD-10-CM | POA: Diagnosis not present

## 2018-08-20 DIAGNOSIS — Z0000 Encounter for general adult medical examination without abnormal findings: Secondary | ICD-10-CM | POA: Diagnosis not present

## 2018-08-20 DIAGNOSIS — I10 Essential (primary) hypertension: Secondary | ICD-10-CM | POA: Diagnosis not present

## 2018-08-27 DIAGNOSIS — E1165 Type 2 diabetes mellitus with hyperglycemia: Secondary | ICD-10-CM | POA: Diagnosis not present

## 2018-09-19 DIAGNOSIS — I10 Essential (primary) hypertension: Secondary | ICD-10-CM | POA: Diagnosis not present

## 2018-09-19 DIAGNOSIS — Z794 Long term (current) use of insulin: Secondary | ICD-10-CM | POA: Diagnosis not present

## 2018-09-19 DIAGNOSIS — E785 Hyperlipidemia, unspecified: Secondary | ICD-10-CM | POA: Diagnosis not present

## 2018-09-19 DIAGNOSIS — E119 Type 2 diabetes mellitus without complications: Secondary | ICD-10-CM | POA: Diagnosis not present

## 2018-11-23 ENCOUNTER — Other Ambulatory Visit: Payer: Self-pay

## 2018-11-23 ENCOUNTER — Emergency Department (HOSPITAL_BASED_OUTPATIENT_CLINIC_OR_DEPARTMENT_OTHER)
Admission: EM | Admit: 2018-11-23 | Discharge: 2018-11-23 | Disposition: A | Discharge status: Home / Self Care | Payer: Medicare Other | Attending: Emergency Medicine | Admitting: Emergency Medicine

## 2018-11-23 ENCOUNTER — Encounter (HOSPITAL_BASED_OUTPATIENT_CLINIC_OR_DEPARTMENT_OTHER): Payer: Self-pay | Admitting: *Deleted

## 2018-11-23 ENCOUNTER — Emergency Department (HOSPITAL_BASED_OUTPATIENT_CLINIC_OR_DEPARTMENT_OTHER): Payer: Medicare Other

## 2018-11-23 DIAGNOSIS — R1031 Right lower quadrant pain: Secondary | ICD-10-CM | POA: Diagnosis not present

## 2018-11-23 DIAGNOSIS — N23 Unspecified renal colic: Secondary | ICD-10-CM | POA: Diagnosis not present

## 2018-11-23 DIAGNOSIS — K8020 Calculus of gallbladder without cholecystitis without obstruction: Secondary | ICD-10-CM

## 2018-11-23 DIAGNOSIS — E119 Type 2 diabetes mellitus without complications: Secondary | ICD-10-CM | POA: Insufficient documentation

## 2018-11-23 DIAGNOSIS — N201 Calculus of ureter: Secondary | ICD-10-CM | POA: Diagnosis not present

## 2018-11-23 DIAGNOSIS — Z7982 Long term (current) use of aspirin: Secondary | ICD-10-CM | POA: Insufficient documentation

## 2018-11-23 DIAGNOSIS — Z79899 Other long term (current) drug therapy: Secondary | ICD-10-CM | POA: Diagnosis not present

## 2018-11-23 DIAGNOSIS — E1165 Type 2 diabetes mellitus with hyperglycemia: Secondary | ICD-10-CM | POA: Diagnosis not present

## 2018-11-23 LAB — COMPREHENSIVE METABOLIC PANEL
ALT: 17 U/L (ref 0–44)
AST: 21 U/L (ref 15–41)
Albumin: 4.3 g/dL (ref 3.5–5.0)
Alkaline Phosphatase: 51 U/L (ref 38–126)
Anion gap: 13 (ref 5–15)
BUN: 17 mg/dL (ref 8–23)
CO2: 23 mmol/L (ref 22–32)
Calcium: 9.6 mg/dL (ref 8.9–10.3)
Chloride: 106 mmol/L (ref 98–111)
Creatinine, Ser: 0.75 mg/dL (ref 0.44–1.00)
GFR calc Af Amer: >60 mL/min (ref >60)
GFR calc non Af Amer: >60 mL/min (ref >60)
Glucose, Bld: 106 mg/dL — ABNORMAL HIGH (ref 70–99)
Potassium: 3.6 mmol/L (ref 3.5–5.1)
Sodium: 142 mmol/L (ref 135–145)
Total Bilirubin: 0.8 mg/dL (ref 0.3–1.2)
Total Protein: 7.3 g/dL (ref 6.5–8.1)

## 2018-11-23 LAB — URINALYSIS, ROUTINE W REFLEX MICROSCOPIC
Bilirubin Urine: NEGATIVE
Glucose, UA: >=500 mg/dL — AB
Ketones, ur: 40 mg/dL — AB
Leukocytes,Ua: NEGATIVE
Nitrite: NEGATIVE
Protein, ur: NEGATIVE mg/dL
Specific Gravity, Urine: 1.02 (ref 1.005–1.030)
pH: 5.5 (ref 5.0–8.0)

## 2018-11-23 LAB — URINALYSIS, MICROSCOPIC (REFLEX): RBC / HPF: >50 RBC/hpf (ref 0–5)

## 2018-11-23 LAB — CBC
HCT: 44 % (ref 36.0–46.0)
Hemoglobin: 13.8 g/dL (ref 12.0–15.0)
MCH: 29 pg (ref 26.0–34.0)
MCHC: 31.4 g/dL (ref 30.0–36.0)
MCV: 92.4 fL (ref 80.0–100.0)
Platelets: 312 10*3/uL (ref 150–400)
RBC: 4.76 MIL/uL (ref 3.87–5.11)
RDW: 13.6 % (ref 11.5–15.5)
WBC: 12.6 10*3/uL — ABNORMAL HIGH (ref 4.0–10.5)
nRBC: 0 % (ref 0.0–0.2)

## 2018-11-23 MED ORDER — ONDANSETRON 8 MG PO TBDP: 8.0000 mg | ORAL_TABLET | Freq: Three times a day (TID) | ORAL | 0 refills | Status: DC | PRN

## 2018-11-23 MED ORDER — OXYCODONE-ACETAMINOPHEN 5-325 MG PO TABS: 1.0000 | ORAL_TABLET | Freq: Four times a day (QID) | ORAL | 0 refills | Status: DC | PRN

## 2018-11-23 MED ORDER — MORPHINE SULFATE (PF) 4 MG/ML IV SOLN
4.0000 mg | Freq: Once | INTRAVENOUS | Status: AC
  Administered 2018-11-23: 16:00:00 4 mg via INTRAVENOUS
  Filled 2018-11-23: qty 1

## 2018-11-23 MED ORDER — ONDANSETRON HCL 4 MG/2ML IJ SOLN
4.0000 mg | Freq: Once | INTRAMUSCULAR | Status: AC
  Administered 2018-11-23: 4 mg via INTRAVENOUS
  Filled 2018-11-23: qty 2

## 2018-11-23 MED ORDER — SODIUM CHLORIDE 0.9 % IV BOLUS
1000.0000 mL | Freq: Once | INTRAVENOUS | Status: AC
  Administered 2018-11-23: 1000 mL via INTRAVENOUS

## 2018-11-23 MED ORDER — KETOROLAC TROMETHAMINE 30 MG/ML IJ SOLN
15.0000 mg | Freq: Once | INTRAMUSCULAR | Status: AC
  Administered 2018-11-23: 15 mg via INTRAVENOUS
  Filled 2018-11-23: qty 1

## 2018-11-23 MED ORDER — IOHEXOL 300 MG/ML  SOLN
100.0000 mL | Freq: Once | INTRAMUSCULAR | Status: AC | PRN
  Administered 2018-11-23: 100 mL via INTRAVENOUS

## 2018-11-23 NOTE — ED Provider Notes (Signed)
[facility] EMERGENCY DEPARTMENT Provider Note   CSN: [id number] Arrival date & time: 11/23/18  1450     History   Chief Complaint Chief Complaint  Patient presents with  . Abdominal Pain    HPI Sally [name] is a 73 y.o. female.     Patient c/o right lower abd pain for the past two days. Symptoms acute onset, moderate, persistent, felt worse today, non radiating. No hx same pain. Nausea. No vomiting. Having normal bms. No abd distension. No dysuria or hematuria. Remote hx kidney stone but felt different. No hx gallstones. Went to urgent care and was sent to ED for CT scan.   The history is provided by the patient.  Abdominal Pain Associated symptoms: no chest pain, no chills, no cough, no diarrhea, no dysuria, no fever, no shortness of breath, no sore throat and no vomiting     Past Medical History:  Diagnosis Date  . Allergic rhinitis   . Diabetes (HCC)   . High cholesterol     There are no active problems to display for this patient.   Past Surgical History:  Procedure Laterality Date  . CATARACT EXTRACTION Bilateral 2016-2017     OB History   No obstetric history on file.      Home Medications    Prior to Admission medications   Medication Sig Start Date End Date Taking? Authorizing Provider  aspirin EC 81 MG tablet Take 81 mg by mouth daily.    [provider]  Bepotastine Besilate (BEPREVE) 1.5 % SOLN Use one drop in each eye twice daily if needed 03/14/16   Kozlow, Alvira PhilipsEric J, MD  cetirizine (ZYRTEC) 10 MG tablet Take 10 mg by mouth daily.    [provider]  Cholecalciferol (VITAMIN D3) 2000 units TABS Take by mouth.    [provider]  fluticasone (FLONASE) 50 MCG/ACT nasal spray Place 2 sprays into both nostrils daily.    [provider]  Glucosamine-Chondroit-Vit C-Mn (GLUCOSAMINE 1500 COMPLEX PO) Take by mouth.    [provider]  HUMALOG KWIKPEN 100 UNIT/ML KiwkPen INJ 1 UNIT [place] PER 10   GRAMS OF CARBS AT MEALS PLUS 1 EXTRA UNIT FOR EVERY 20 MG/DL ABOVE 409100 MG/DL. MAX OF 40 UNITS PER DAY 02/21/16   [provider]  ibuprofen (ADVIL,MOTRIN) 800 MG tablet  12/16/15   [provider]  INVOKAMET [phone] MG TABS  12/19/15   [provider]  LANTUS SOLOSTAR 100 UNIT/ML Solostar Pen  02/20/16   [provider]  Melatonin 5 MG CAPS Take 5 mg by mouth at bedtime.    [provider]  montelukast (SINGULAIR) 10 MG tablet Take one tablet once daily as directed 03/14/16   Kozlow, Alvira PhilipsEric J, MD  rosuvastatin (CRESTOR) 10 MG tablet  03/01/16   [provider]    Family History Family History  Problem Relation Age of Onset  . Cancer Mother   . Heart attack Father   . Allergic rhinitis Neg Hx   . Asthma Neg Hx   . Breast cancer Neg Hx     Social History Social History   Tobacco Use  . Smoking status: Never Smoker  . Smokeless tobacco: Never Used  Substance Use Topics  . Alcohol use: Yes  . Drug use: Never     Allergies   Codeine, Lipitor [atorvastatin], and Zocor [simvastatin]   Review of Systems Review of Systems  Constitutional: Negative for chills and fever.  HENT: Negative for sore throat.  Eyes: Negative for redness.  Respiratory: Negative for cough and shortness of breath.   Cardiovascular: Negative for chest pain.  Gastrointestinal: Positive for abdominal pain. Negative for diarrhea and vomiting.  Endocrine: Negative for polyuria.  Genitourinary: Negative for dysuria and flank pain.  Musculoskeletal: Negative for back pain and neck pain.  Skin: Negative for rash.  Neurological: Negative for headaches.  Hematological: Does not bruise/bleed easily.  Psychiatric/Behavioral: Negative for confusion.     Physical Exam Updated Vital Signs BP (!) 163/70 (BP Location: Left Arm)   Pulse 77   Temp 98.3 F (36.8 C) (Oral)   Resp 20   SpO2 100%   Physical Exam Vitals signs and nursing note reviewed.   Constitutional:      Appearance: Normal appearance. She is well-developed.  HENT:     Head: Atraumatic.     Nose: Nose normal.     Mouth/Throat:     Mouth: Mucous membranes are moist.  Eyes:     General: No scleral icterus.    Conjunctiva/sclera: Conjunctivae normal.  Neck:     Musculoskeletal: Normal range of motion and neck supple. No neck rigidity or muscular tenderness.     Trachea: No tracheal deviation.  Cardiovascular:     Rate and Rhythm: Normal rate and regular rhythm.     Pulses: Normal pulses.     Heart sounds: Normal heart sounds. No murmur. No friction rub. No gallop.   Pulmonary:     Effort: Pulmonary effort is normal. No respiratory distress.     Breath sounds: Normal breath sounds.  Abdominal:     General: Bowel sounds are normal. There is no distension.     Palpations: Abdomen is soft. There is no mass.     Tenderness: There is abdominal tenderness. There is no guarding.     Hernia: No hernia is present.     Comments: RLQ tenderness.   Genitourinary:    Comments: No cva tenderness.  Musculoskeletal:        General: No swelling or tenderness.  Skin:    General: Skin is warm and dry.     Findings: No rash.  Neurological:     Mental Status: She is alert.     Comments: Alert, speech normal.   Psychiatric:        Mood and Affect: Mood normal.      ED Treatments / Results  Labs (all labs ordered are listed, but only abnormal results are displayed) Results for orders placed or performed during the hospital encounter of 11/23/18  CBC  Result Value Ref Range   WBC 12.6 (H) 4.0 - 10.5 K/uL   RBC 4.76 3.87 - 5.11 MIL/uL   Hemoglobin 13.8 12.0 - 15.0 g/dL   HCT 44.0 36.0 - 46.0 %   MCV 92.4 80.0 - 100.0 fL   MCH 29.0 26.0 - 34.0 pg   MCHC 31.4 30.0 - 36.0 g/dL   RDW 13.6 11.5 - 15.5 %   Platelets 312 150 - 400 K/uL   nRBC 0.0 0.0 - 0.2 %  Comprehensive metabolic panel  Result Value Ref Range   Sodium 142 135 - 145 mmol/L   Potassium 3.6 3.5 - 5.1  mmol/L   Chloride 106 98 - 111 mmol/L   CO2 23 22 - 32 mmol/L   Glucose, Bld 106 (H) 70 - 99 mg/dL   BUN 17 8 - 23 mg/dL   Creatinine, Ser 0.75 0.44 - 1.00 mg/dL   Calcium 9.6 8.9 - 10.3 mg/dL  Total Protein 7.3 6.5 - 8.1 g/dL   Albumin 4.3 3.5 - 5.0 g/dL   AST 21 15 - 41 U/L   ALT 17 0 - 44 U/L   Alkaline Phosphatase 51 38 - 126 U/L   Total Bilirubin 0.8 0.3 - 1.2 mg/dL   GFR calc non Af Amer >60 >60 mL/min   GFR calc Af Amer >60 >60 mL/min   Anion gap 13 5 - 15  Urinalysis, Routine w reflex microscopic  Result Value Ref Range   Color, Urine STRAW (A) YELLOW   APPearance CLEAR CLEAR   Specific Gravity, Urine 1.020 1.005 - 1.030   pH 5.5 5.0 - 8.0   Glucose, UA >=500 (A) NEGATIVE mg/dL   Hgb urine dipstick LARGE (A) NEGATIVE   Bilirubin Urine NEGATIVE NEGATIVE   Ketones, ur 40 (A) NEGATIVE mg/dL   Protein, ur NEGATIVE NEGATIVE mg/dL   Nitrite NEGATIVE NEGATIVE   Leukocytes,Ua NEGATIVE NEGATIVE  Urinalysis, Microscopic (reflex)  Result Value Ref Range   RBC / HPF >50 0 - 5 RBC/hpf   WBC, UA 0-5 0 - 5 WBC/hpf   Bacteria, UA FEW (A) NONE SEEN   Squamous Epithelial / LPF 0-5 0 - 5    EKG None  Radiology Ct Abdomen Pelvis W Contrast  Result Date: 11/23/2018 CLINICAL DATA:  Right lower quadrant pain x 2-3 days, nausea EXAM: CT ABDOMEN AND PELVIS WITH CONTRAST TECHNIQUE: Multidetector CT imaging of the abdomen and pelvis was performed using the standard protocol following bolus administration of intravenous contrast. CONTRAST:  OMNIPAQUE IOHEXOL 300 MG/ML  SOLN COMPARISON:  05/16/2008 FINDINGS: Lower chest: Minimal dependent atelectasis in the bilateral lower lobes. Hepatobiliary: Liver is within normal limits. Layering gallstones, without associated inflammatory changes. No intrahepatic or extrahepatic duct dilatation. Pancreas: Within normal limits. Spleen: Within normal limits. Adrenals/Urinary Tract: Adrenal glands are within normal limits. Left kidney is within normal  limits. 14 mm posterior right upper pole renal cyst. Moderate right hydroureteronephrosis. Associated 6 mm proximal right ureteral calculus at the L5 level (coronal image 45). Bladder is within normal limits. Stomach/Bowel: Stomach is notable for a tiny hiatal hernia. No evidence of bowel obstruction. Normal appendix (series 2/image 86). Mild left colonic diverticulosis, without evidence of diverticulitis. Vascular/Lymphatic: No evidence of abdominal aortic aneurysm. Atherosclerotic calcifications of the abdominal aorta and branch vessels. No suspicious abdominopelvic lymphadenopathy. Reproductive: Uterus is within normal limits. Bilateral ovaries are within normal limits. Other: No abdominopelvic ascites. Musculoskeletal: Degenerative changes of the visualized thoracolumbar spine. IMPRESSION: 6 mm proximal right ureteral calculus at the L5 level. Associated moderate right hydronephrosis. Cholelithiasis, without associated inflammatory changes. No evidence of bowel obstruction.  Normal appendix. Electronically Signed   By: Charline Bills M.D.   On: 11/23/2018 16:29    Procedures Procedures (including critical care time)  Medications Ordered in ED Medications  sodium chloride 0.9 % bolus 1,000 mL (1,000 mLs Intravenous New Bag/Given 11/23/18 1531)  morphine 4 MG/ML injection 4 mg (4 mg Intravenous Given 11/23/18 1532)  ondansetron (ZOFRAN) injection 4 mg (4 mg Intravenous Given 11/23/18 1532)     Initial Impression / Assessment and Plan / ED Course  I have reviewed the triage vital signs and the nursing notes.  Pertinent labs & imaging results that were available during my care of the patient were reviewed by me and considered in my medical decision making (see chart for details).  Iv ns bolus. Morphine iv. zofran iv. Stat labs. Imaging ordered.   Reviewed nursing notes and prior charts  for additional history.   Labs reviewed by me - wbc elevated. Await CT.   Pain improved w meds. Ivf.   CT  reviewed by me - right ureteral stone. Also incidental finding gallstones.   toradol iv.   Recheck pain resolved.   Recheck abd soft nt. No vomiting.     Final Clinical Impressions(s) / ED Diagnoses   Final diagnoses:  None    ED Discharge Orders    None       Cathren LaineSteinl, Cresencio Reesor, MD 11/23/18 1800

## 2018-11-23 NOTE — Discharge Instructions (Addendum)
It was our pleasure to provide your ER care today - we hope that you feel better.  Your ct scan was read as showing a 6 mm stone in the proximal right ureter. Drink plenty of fluids, strain urine. Follow up with urologist in 1 week if symptoms fail to improve/resolve.  Incidental note was also made of gallstones - if symptomatic from gallstones, follow up with general surgeon in the next couple weeks.   Take motrin or aleve as need for pain. You may also take percocet as need for pain. No driving for the next 6 hours or when taking percocet. Also, do not take tylenol or acetaminophen containing medication when taking percocet. Take zofran as need for nausea.  Return to ER if worse, new symptoms, fevers, worsening or intractable pain, persistent vomiting, or other concern.

## 2018-11-23 NOTE — ED Triage Notes (Signed)
RLQ pain and nausea since Friday. Seen at [facility] and sent here for CT scan

## 2018-11-23 NOTE — ED Notes (Signed)
Pt advised that urine sample is needed, pt states that she gave sample at clinic pta and is unable to void at this time

## 2018-11-23 NOTE — ED Notes (Signed)
Pt updated husband with plan of care.

## 2018-11-27 DIAGNOSIS — Z0120 Encounter for dental examination and cleaning without abnormal findings: Secondary | ICD-10-CM | POA: Diagnosis not present

## 2018-11-28 DIAGNOSIS — K8020 Calculus of gallbladder without cholecystitis without obstruction: Secondary | ICD-10-CM | POA: Diagnosis not present

## 2018-11-28 DIAGNOSIS — M25511 Pain in right shoulder: Secondary | ICD-10-CM | POA: Diagnosis not present

## 2018-11-28 DIAGNOSIS — N200 Calculus of kidney: Secondary | ICD-10-CM | POA: Diagnosis not present

## 2018-12-02 DIAGNOSIS — N201 Calculus of ureter: Secondary | ICD-10-CM | POA: Diagnosis not present

## 2018-12-02 DIAGNOSIS — N23 Unspecified renal colic: Secondary | ICD-10-CM | POA: Diagnosis not present

## 2018-12-03 ENCOUNTER — Other Ambulatory Visit: Payer: Self-pay | Admitting: Urology

## 2018-12-03 ENCOUNTER — Encounter (HOSPITAL_BASED_OUTPATIENT_CLINIC_OR_DEPARTMENT_OTHER): Payer: Self-pay | Admitting: *Deleted

## 2018-12-03 ENCOUNTER — Other Ambulatory Visit: Payer: Self-pay

## 2018-12-03 DIAGNOSIS — M7541 Impingement syndrome of right shoulder: Secondary | ICD-10-CM | POA: Diagnosis not present

## 2018-12-03 NOTE — Progress Notes (Signed)
Spoke w/ via phone for pre-op interview--- [facility] needs dos----  istat 8, ekg            COVID test ------12-04-2018 Arrive at -------800 NPO after ------midnight Medications to take morning of surgery -----oxycodone prn, zyrtec, flonase, eye drop Diabetic medication -----none day of surgery Patient Special Instructions ----- Pre-Op special Istructions ----- Patient verbalized understanding of instructions that were given at this phone interview. Patient denies shortness of breath, chest pain, fever, cough a this phone interview.

## 2018-12-04 ENCOUNTER — Other Ambulatory Visit (HOSPITAL_COMMUNITY)
Admission: RE | Admit: 2018-12-04 | Discharge: 2018-12-04 | Disposition: A | Discharge status: Home / Self Care | Payer: Medicare Other | Source: Ambulatory Visit | Attending: Urology | Admitting: Urology

## 2018-12-04 DIAGNOSIS — Z20828 Contact with and (suspected) exposure to other viral communicable diseases: Secondary | ICD-10-CM | POA: Diagnosis not present

## 2018-12-05 LAB — NOVEL CORONAVIRUS, NAA (HOSP ORDER, SEND-OUT TO REF LAB; TAT 18-24 HRS): SARS-CoV-2, NAA: NOT DETECTED

## 2018-12-08 ENCOUNTER — Ambulatory Visit (HOSPITAL_BASED_OUTPATIENT_CLINIC_OR_DEPARTMENT_OTHER)
Admission: RE | Admit: 2018-12-08 | Discharge: 2018-12-08 | Disposition: A | Discharge status: Home / Self Care | Payer: Medicare Other | Attending: Urology | Admitting: Urology

## 2018-12-08 ENCOUNTER — Encounter (HOSPITAL_BASED_OUTPATIENT_CLINIC_OR_DEPARTMENT_OTHER)
Admission: RE | Disposition: A | Discharge status: Home / Self Care | Payer: Self-pay | Source: Home / Self Care | Attending: Urology

## 2018-12-08 ENCOUNTER — Encounter (HOSPITAL_BASED_OUTPATIENT_CLINIC_OR_DEPARTMENT_OTHER): Payer: Self-pay | Admitting: Anesthesiology

## 2018-12-08 ENCOUNTER — Ambulatory Visit (HOSPITAL_BASED_OUTPATIENT_CLINIC_OR_DEPARTMENT_OTHER): Payer: Medicare Other | Admitting: Anesthesiology

## 2018-12-08 ENCOUNTER — Other Ambulatory Visit: Payer: Self-pay

## 2018-12-08 DIAGNOSIS — E7800 Pure hypercholesterolemia, unspecified: Secondary | ICD-10-CM | POA: Diagnosis not present

## 2018-12-08 DIAGNOSIS — E119 Type 2 diabetes mellitus without complications: Secondary | ICD-10-CM | POA: Diagnosis not present

## 2018-12-08 DIAGNOSIS — Z87442 Personal history of urinary calculi: Secondary | ICD-10-CM | POA: Diagnosis not present

## 2018-12-08 DIAGNOSIS — N8110 Cystocele, unspecified: Secondary | ICD-10-CM | POA: Diagnosis not present

## 2018-12-08 DIAGNOSIS — Z79899 Other long term (current) drug therapy: Secondary | ICD-10-CM | POA: Insufficient documentation

## 2018-12-08 DIAGNOSIS — Z8249 Family history of ischemic heart disease and other diseases of the circulatory system: Secondary | ICD-10-CM | POA: Diagnosis not present

## 2018-12-08 DIAGNOSIS — Z794 Long term (current) use of insulin: Secondary | ICD-10-CM | POA: Insufficient documentation

## 2018-12-08 DIAGNOSIS — E785 Hyperlipidemia, unspecified: Secondary | ICD-10-CM | POA: Insufficient documentation

## 2018-12-08 DIAGNOSIS — N201 Calculus of ureter: Secondary | ICD-10-CM | POA: Insufficient documentation

## 2018-12-08 HISTORY — PX: CYSTOSCOPY/URETEROSCOPY/HOLMIUM LASER/STENT PLACEMENT: SHX6546

## 2018-12-08 HISTORY — DX: Calculus of ureter: N20.1

## 2018-12-08 LAB — GLUCOSE, CAPILLARY: Glucose-Capillary: 217 mg/dL — ABNORMAL HIGH (ref 70–99)

## 2018-12-08 LAB — POCT I-STAT, CHEM 8
BUN: 18 mg/dL (ref 8–23)
Calcium, Ion: 1.28 mmol/L (ref 1.15–1.40)
Chloride: 106 mmol/L (ref 98–111)
Creatinine, Ser: 0.5 mg/dL (ref 0.44–1.00)
Glucose, Bld: 259 mg/dL — ABNORMAL HIGH (ref 70–99)
HCT: 40 % (ref 36.0–46.0)
Hemoglobin: 13.6 g/dL (ref 12.0–15.0)
Potassium: 4 mmol/L (ref 3.5–5.1)
Sodium: 141 mmol/L (ref 135–145)
TCO2: 23 mmol/L (ref 22–32)

## 2018-12-08 SURGERY — CYSTOSCOPY/URETEROSCOPY/HOLMIUM LASER/STENT PLACEMENT
Anesthesia: General | Site: Ureter | Laterality: Right

## 2018-12-08 MED ORDER — FENTANYL CITRATE (PF) 100 MCG/2ML IJ SOLN
INTRAMUSCULAR | Status: AC
  Filled 2018-12-08: qty 2

## 2018-12-08 MED ORDER — OXYCODONE HCL 5 MG/5ML PO SOLN
5.0000 mg | Freq: Once | ORAL | Status: DC | PRN
  Filled 2018-12-08: qty 5

## 2018-12-08 MED ORDER — MIDAZOLAM HCL 5 MG/5ML IJ SOLN
INTRAMUSCULAR | Status: DC | PRN
  Administered 2018-12-08: 1 mg via INTRAVENOUS

## 2018-12-08 MED ORDER — LIDOCAINE 2% (20 MG/ML) 5 ML SYRINGE
INTRAMUSCULAR | Status: AC
  Filled 2018-12-08: qty 5

## 2018-12-08 MED ORDER — PROPOFOL 10 MG/ML IV BOLUS
INTRAVENOUS | Status: DC | PRN
  Administered 2018-12-08: 140 mg via INTRAVENOUS
  Administered 2018-12-08: 20 mg via INTRAVENOUS

## 2018-12-08 MED ORDER — CEFAZOLIN SODIUM-DEXTROSE 2-4 GM/100ML-% IV SOLN
2.0000 g | INTRAVENOUS | Status: AC
  Administered 2018-12-08: 2 g via INTRAVENOUS
  Filled 2018-12-08: qty 100

## 2018-12-08 MED ORDER — SODIUM CHLORIDE 0.9 % IR SOLN
Status: DC | PRN
  Administered 2018-12-08: 3000 mL via INTRAVESICAL

## 2018-12-08 MED ORDER — KETOROLAC TROMETHAMINE 15 MG/ML IJ SOLN
INTRAMUSCULAR | Status: DC | PRN
  Administered 2018-12-08: 15 mg via INTRAVENOUS

## 2018-12-08 MED ORDER — PROPOFOL 500 MG/50ML IV EMUL
INTRAVENOUS | Status: AC
  Filled 2018-12-08: qty 50

## 2018-12-08 MED ORDER — ONDANSETRON HCL 4 MG/2ML IJ SOLN
4.0000 mg | Freq: Once | INTRAMUSCULAR | Status: DC | PRN
  Filled 2018-12-08: qty 2

## 2018-12-08 MED ORDER — ONDANSETRON HCL 4 MG/2ML IJ SOLN
INTRAMUSCULAR | Status: AC
  Filled 2018-12-08: qty 2

## 2018-12-08 MED ORDER — OXYCODONE HCL 5 MG PO TABS
5.0000 mg | ORAL_TABLET | Freq: Once | ORAL | Status: DC | PRN
  Filled 2018-12-08: qty 1

## 2018-12-08 MED ORDER — CEFAZOLIN SODIUM-DEXTROSE 2-4 GM/100ML-% IV SOLN
INTRAVENOUS | Status: AC
  Filled 2018-12-08: qty 100

## 2018-12-08 MED ORDER — MIDAZOLAM HCL 2 MG/2ML IJ SOLN
INTRAMUSCULAR | Status: AC
  Filled 2018-12-08: qty 2

## 2018-12-08 MED ORDER — KETOROLAC TROMETHAMINE 30 MG/ML IJ SOLN
INTRAMUSCULAR | Status: AC
  Filled 2018-12-08: qty 1

## 2018-12-08 MED ORDER — FENTANYL CITRATE (PF) 100 MCG/2ML IJ SOLN
25.0000 ug | INTRAMUSCULAR | Status: DC | PRN
  Filled 2018-12-08: qty 1

## 2018-12-08 MED ORDER — LACTATED RINGERS IV SOLN
INTRAVENOUS | Status: DC
  Administered 2018-12-08: 09:00:00 via INTRAVENOUS
  Filled 2018-12-08: qty 1000

## 2018-12-08 MED ORDER — DEXAMETHASONE SODIUM PHOSPHATE 4 MG/ML IJ SOLN
INTRAMUSCULAR | Status: DC | PRN
  Administered 2018-12-08: 4 mg via INTRAVENOUS

## 2018-12-08 MED ORDER — ARTIFICIAL TEARS OPHTHALMIC OINT
TOPICAL_OINTMENT | OPHTHALMIC | Status: AC
  Filled 2018-12-08: qty 3.5

## 2018-12-08 MED ORDER — LIDOCAINE 2% (20 MG/ML) 5 ML SYRINGE
INTRAMUSCULAR | Status: DC | PRN
  Administered 2018-12-08: 60 mg via INTRAVENOUS

## 2018-12-08 MED ORDER — IOHEXOL 300 MG/ML  SOLN
INTRAMUSCULAR | Status: DC | PRN
  Administered 2018-12-08: 10:00:00 20 mL via URETHRAL

## 2018-12-08 MED ORDER — FENTANYL CITRATE (PF) 100 MCG/2ML IJ SOLN
INTRAMUSCULAR | Status: DC | PRN
  Administered 2018-12-08 (×2): 50 ug via INTRAVENOUS

## 2018-12-08 MED ORDER — ONDANSETRON HCL 4 MG/2ML IJ SOLN
INTRAMUSCULAR | Status: DC | PRN
  Administered 2018-12-08: 4 mg via INTRAVENOUS

## 2018-12-08 MED ORDER — DEXAMETHASONE SODIUM PHOSPHATE 10 MG/ML IJ SOLN
INTRAMUSCULAR | Status: AC
  Filled 2018-12-08: qty 1

## 2018-12-08 SURGICAL SUPPLY — 19 items
BAG DRAIN URO-CYSTO SKYTR STRL (DRAIN) ×2 IMPLANT
BAG DRN UROCATH (DRAIN) ×1
BASKET ZERO TIP NITINOL 2.4FR (BASKET) ×1 IMPLANT
BSKT STON RTRVL ZERO TP 2.4FR (BASKET) ×1
CATH INTERMIT  6FR 70CM (CATHETERS) IMPLANT
CLOTH BEACON ORANGE TIMEOUT ST (SAFETY) ×2 IMPLANT
FIBER LASER FLEXIVA 365 (UROLOGICAL SUPPLIES) IMPLANT
FIBER LASER TRAC TIP (UROLOGICAL SUPPLIES) ×1 IMPLANT
GLOVE BIO SURGEON STRL SZ7.5 (GLOVE) ×2 IMPLANT
GOWN STRL REUS W/TWL XL LVL3 (GOWN DISPOSABLE) IMPLANT
GUIDEWIRE STR DUAL SENSOR (WIRE) ×2 IMPLANT
IV NS IRRIG 3000ML ARTHROMATIC (IV SOLUTION) ×2 IMPLANT
KIT TURNOVER CYSTO (KITS) ×2 IMPLANT
MANIFOLD NEPTUNE II (INSTRUMENTS) ×2 IMPLANT
NS IRRIG 500ML POUR BTL (IV SOLUTION) ×3 IMPLANT
PACK CYSTO (CUSTOM PROCEDURE TRAY) ×2 IMPLANT
STENT URET 6FRX24 CONTOUR (STENTS) ×1 IMPLANT
TUBE CONNECTING 12X1/4 (SUCTIONS) ×1 IMPLANT
TUBING UROLOGY SET (TUBING) ×1 IMPLANT

## 2018-12-08 NOTE — H&P (Signed)
CC/HPI: CC: Right ureteral calculus  HPI:  12/02/2018  73 year old female with history nephrolithiasis about 20+ years ago that passed on its own. She went to the emergency department on 11/23/2018. This revealed a 6 mm proximal to mid ureteral calculus. She continues to have intermittent right-sided flank pain. KUB shows persistence of the stone but interval migration. She denies any gross hematuria or dysuria. No voiding complaints. No nausea, vomiting, fever, chill. She is taking Motrin intermittently. Last Motrin was yesterday. She does not take aspirin or blood thinners. She desires intervention.     ALLERGIES: None   MEDICATIONS: Metformin Hcl 500 mg tablet  Zyrtec  Flomax 0.4 mg capsule  Humalog Kwikpen U-100  Jardiance 25 mg tablet  Lantus Solostar  Ondansetron Hcl  Oxycodone-Acetaminophen  Rosuvastatin Calcium  Vitamin B12  Vitamin D3     GU PSH: None   NON-GU PSH: Eye Surgery (Unspecified)     GU PMH: None   NON-GU PMH: Asthma Diabetes Type 2 Hypercholesterolemia    FAMILY HISTORY: Cancer - Mother Heart Attack - Father   SOCIAL HISTORY: Marital Status: Married Preferred Language: English; Race: White Current Smoking Status: Patient has never smoked.   Tobacco Use Assessment Completed: Used Tobacco in last 30 days? Drinks 4+ caffeinated drinks per day.     Notes: 1 son, 1 daughter    REVIEW OF SYSTEMS:    GU Review Female:   Patient reports frequent urination, hard to postpone urination, and get up at night to urinate. Patient denies burning /pain with urination, leakage of urine, stream starts and stops, trouble starting your stream, have to strain to urinate, and being pregnant.  Gastrointestinal (Upper):   Patient reports nausea. Patient denies vomiting and indigestion/ heartburn.  Gastrointestinal (Lower):   Patient denies diarrhea and constipation.  Constitutional:   Patient reports fatigue. Patient denies fever, night sweats, and weight loss.  Skin:    Patient denies skin rash/ lesion and itching.  Eyes:   Patient denies blurred vision and double vision.  Ears/ Nose/ Throat:   Patient reports sinus problems. Patient denies sore throat.  Hematologic/Lymphatic:   Patient reports easy bruising. Patient denies swollen glands.  Cardiovascular:   Patient denies leg swelling and chest pains.  Respiratory:   Patient denies cough and shortness of breath.  Endocrine:   Patient denies excessive thirst.  Musculoskeletal:   Patient denies back pain and joint pain.  Neurological:   Patient denies dizziness and headaches.  Psychologic:   Patient denies depression and anxiety.   VITAL SIGNS:      12/02/2018 03:59 PM  Weight 197 lb / 89.36 kg  Height 64 in / 162.56 cm  BP 131/83 mmHg  Pulse 77 /min  Temperature 97.1 F / 36.1 C  BMI 33.8 kg/m   MULTI-SYSTEM PHYSICAL EXAMINATION:    Constitutional: Well-nourished. No physical deformities. Normally developed. Good grooming.  Respiratory: No labored breathing, no use of accessory muscles.   Cardiovascular: Normal temperature, adequate perfusion of extremities  Skin: No paleness, no jaundice  Neurologic / Psychiatric: Oriented to time, oriented to place, oriented to person. No depression, no anxiety, no agitation.  Gastrointestinal: No mass, no tenderness, no rigidity, mildly obese abdomen.   Eyes: Normal conjunctivae. Normal eyelids.  Musculoskeletal: Normal gait and station of head and neck.     PAST DATA REVIEWED:  Source Of History:  Patient  Records Review:   Previous Doctor Records, Previous Patient Records  X-Ray Review: C.T. Abdomen/Pelvis: Reviewed Films. Reviewed Report. Discussed With Patient.  PROCEDURES:         KUB - [id number]  A single view of the abdomen is obtained.  Interval migration of 6 mm right ureteral calculus into the distal ureter      Patient confirmed No Neulasta OnPro Device.            Urinalysis w/Scope Dipstick Dipstick Cont'd Micro  Color: Yellow  Bilirubin: Neg mg/dL WBC/hpf: 0 - 5/hpf  Appearance: Clear Ketones: Neg mg/dL RBC/hpf: 40 - 60/hpf  Specific Gravity: 1.010 Blood: 3+ ery/uL Bacteria: Rare (0-9/hpf)  pH: 5.5 Protein: Neg mg/dL Cystals: NS (Not Seen)  Glucose: 3+ mg/dL Urobilinogen: 0.2 mg/dL Casts: NS (Not Seen)    Nitrites: Neg Trichomonas: Not Present    Leukocyte Esterase: 1+ leu/uL Mucous: Not Present      Epithelial Cells: 0 - 5/hpf      Yeast: NS (Not Seen)      Sperm: Not Present    Notes: Renal tubular epithelials observed.    ASSESSMENT:      ICD-10 Details  1 GU:   Ureteral calculus - E95.2   2   Renal colic - W41    PLAN:           Orders Labs Urine Culture  X-Rays: KUB          Schedule         Document Letter(s):  Created for Patient: Clinical Summary         Notes:   We discussed the management of urinary stones. These options include observation, ureteroscopy, and shockwave lithotripsy. We discussed which options are relevant to these particular stones. We discussed the natural history of stones as well as the complications of untreated stones and the impact on quality of life without treatment as well as with each of the above listed treatments. We also discussed the efficacy of each treatment in its ability to clear the stone burden. With any of these management options I discussed the signs and symptoms of infection and the need for emergent treatment should these be experienced. For each option we discussed the ability of each procedure to clear the patient of their stone burden.   For observation I described the risks which include but are not limited to silent renal damage, life-threatening infection, need for emergent surgery, failure to pass stone, and pain.   For ureteroscopy I described the risks which include heart attack, stroke, pulmonary embolus, death, bleeding, infection, damage to contiguous structures, positioning injury, ureteral stricture, ureteral avulsion, ureteral injury,  need for ureteral stent, inability to perform ureteroscopy, need for an interval procedure, inability to clear stone burden, stent discomfort and pain.   For shockwave lithotripsy I described the risks which include arrhythmia, kidney contusion, kidney hemorrhage, need for transfusion, pain, inability to break up stone, inability to pass stone fragments, Steinstrasse, infection associated with obstructing stones, need for different surgical procedure, need for repeat shockwave lithotripsy.   She is leaning towards ureteroscopy. We will get her set up for that unless she changes her mind and wants ESWL.   CC: Dr. Rex Kras

## 2018-12-08 NOTE — Op Note (Signed)
Operative Note  Preoperative diagnosis:  1.  Right ureteral calculus  Postoperative diagnosis: 1.  Right ureteral calculus 2.  Grade 2/3 cystocele  Procedure(s): 1.  Cystoscopy, right retrograde pyelogram, right ureteroscopy with laser lithotripsy and stone basketing, ureteral stent placement  Surgeon: Link Snuffer, MD  Assistants: None  Anesthesia: General  Complications: None immediate  EBL: Minimal  Specimens: 1.  Renal calculus  Drains/Catheters: 1.  6 x 24 double-J ureteral stent  Intraoperative findings: 1.  Grade 2/3 cystocele 2.  Normal urethra and bladder 3.  Right retrograde pyelogram revealed no obvious hydronephrosis.  There was no obvious filling defect. 4.  Ureteroscopy revealed a 6 mm calculus that was fragmented and then basket extracted.  Indication: 73 year old female with a right ureteral calculus elected for the above operation  Description of procedure:  The patient was identified and consent was obtained.  The patient was taken to the operating room and placed in the supine position.  The patient was placed under general anesthesia.  Perioperative antibiotics were administered.  The patient was placed in dorsal lithotomy.  Patient was prepped and draped in a standard sterile fashion and a timeout was performed.  A 21 French rigid cystoscope was advanced into the urethra and into the bladder.  Complete cystoscopy was performed with the findings noted above.  A sensor wire was advanced up the right ureter and into the kidney under fluoroscopic guidance.  A semirigid ureteroscope was advanced alongside the wire up to the stone of interest which was fragmented with a laser to smaller fragments.  I then basket extracted all the fragments atraumatically.  I then advanced the scope up to the renal pelvis and no other calculi were seen.  I shot a retrograde pyelogram through the scope with the findings noted above.  I then withdrew the scope visualizing the entire  ureter upon removal and again no ureteral calculi were seen.  There was no significant ureteral trauma.  There was some edema at the level of the stone.  I then backloaded the wire onto a rigid cystoscope and advanced that into the bladder followed by routine placement of a 6 x 24 double-J ureteral stent.  Fluoroscopy confirmed proximal placement and direct visualization confirmed a good coil within the bladder.  I drained the bladder and withdrew the scope.  Patient tolerated the procedure well and was stable postoperatively.  Plan: Return in 1 week for stent removal

## 2018-12-08 NOTE — Interval H&P Note (Signed)
History and Physical Interval Note:  12/08/2018 9:46 AM  Sally [name]  has presented today for surgery, with the diagnosis of RIGHT URETERAL STONE.  The various methods of treatment have been discussed with the patient and family. After consideration of risks, benefits and other options for treatment, the patient has consented to  Procedure(s): CYSTOSCOPY/URETEROSCOPY/HOLMIUM LASER/STENT PLACEMENT (Right) as a surgical intervention.  The patient's history has been reviewed, patient examined, no change in status, stable for surgery.  I have reviewed the patient's chart and labs.  Questions were answered to the patient's satisfaction.     Marton Redwood, III

## 2018-12-08 NOTE — Anesthesia Preprocedure Evaluation (Signed)
Anesthesia Evaluation  Patient identified by MRN, date of birth, ID band Patient awake    Reviewed: Allergy & Precautions, NPO status , Patient's Chart, lab work & pertinent test results  History of Anesthesia Complications Negative for: history of anesthetic complications  Airway Mallampati: I  TM Distance: >3 FB Neck ROM: Full    Dental  (+) Teeth Intact   Pulmonary neg pulmonary ROS,    Pulmonary exam normal        Cardiovascular negative cardio ROS Normal cardiovascular exam     Neuro/Psych negative neurological ROS  negative psych ROS   GI/Hepatic negative GI ROS, Neg liver ROS,   Endo/Other  diabetes, Type 2, Insulin Dependent, Oral Hypoglycemic Agents  Renal/GU Renal disease (right ureteral stone)  negative genitourinary   Musculoskeletal negative musculoskeletal ROS (+)   Abdominal   Peds  Hematology negative hematology ROS (+)   Anesthesia Other Findings HLD  Reproductive/Obstetrics                             Anesthesia Physical Anesthesia Plan  ASA: III  Anesthesia Plan: General   Post-op Pain Management:    Induction: Intravenous  PONV Risk Score and Plan: 3 and Ondansetron, Dexamethasone, Treatment may vary due to age or medical condition and Midazolam  Airway Management Planned: LMA  Additional Equipment: None  Intra-op Plan:   Post-operative Plan: Extubation in OR  Informed Consent: I have reviewed the patients History and Physical, chart, labs and discussed the procedure including the risks, benefits and alternatives for the proposed anesthesia with the patient or authorized representative who has indicated his/her understanding and acceptance.     Dental advisory given  Plan Discussed with:   Anesthesia Plan Comments:         Anesthesia Quick Evaluation

## 2018-12-08 NOTE — Discharge Instructions (Signed)
[facility] [phone] Post Ureteroscopy With or Without Stent Instructions  Definitions:  Ureter: The duct that transports urine from the kidney to the bladder. Stent:   A plastic hollow tube that is placed into the ureter, from the kidney to the                 bladder to prevent the ureter from swelling shut.  GENERAL INSTRUCTIONS:  Despite the fact that no skin incisions were used, the area around the ureter and bladder is raw and irritated. The stent is a foreign body which will further irritate the bladder wall. This irritation is manifested by increased frequency of urination, both day and night, and by an increase in the urge to urinate. In some, the urge to urinate is present almost always. Sometimes the urge is strong enough that you may not be able to stop yourself from urinating. The only real cure is to remove the stent and then give time for the bladder wall to heal which can't be done until the danger of the ureter swelling shut has passed, which varies.  You may see some blood in your urine while the stent is in place and a few days afterwards. Do not be alarmed, even if the urine was clear for a while. Get off your feet and drink lots of fluids until clearing occurs. If you start to pass clots or don't improve, call us.  DIET: You may return to your normal diet immediately. Because of the raw surface of your bladder, alcohol, spicy foods, acid type foods and drinks with caffeine may cause irritation or frequency and should be used in moderation. To keep your urine flowing freely and to avoid constipation, drink plenty of fluids during the day ( 8-10 glasses ). Tip: Avoid cranberry juice because it is very acidic.  ACTIVITY: Your physical activity doesn't need to be restricted. However, if you are very active, you may see some blood in your urine. We suggest that you reduce your activity under these circumstances until the bleeding has stopped.  BOWELS: It is  important to keep your bowels regular during the postoperative period. Straining with bowel movements can cause bleeding. A bowel movement every other day is reasonable. Use a mild laxative if needed, such as Milk of Magnesia 2-3 tablespoons, or 2 Dulcolax tablets. Call if you continue to have problems. If you have been taking narcotics for pain, before, during or after your surgery, you may be constipated. Take a laxative if necessary.   MEDICATION: You should resume your pre-surgery medications unless told not to. You may take oxybutynin or flomax if prescribed for bladder spasms or discomfort from the stent Take pain medication as directed for pain refractory to conservative management  PROBLEMS YOU SHOULD REPORT TO US: Fevers over 100.5 Fahrenheit. Heavy bleeding, or clots ( See above notes about blood in urine ). Inability to urinate. Drug reactions ( hives, rash, nausea, vomiting, diarrhea ). Severe burning or pain with urination that is not improving.   Post Anesthesia Home Care Instructions  Activity: Get plenty of rest for the remainder of the day. A responsible individual must stay with you for 24 hours following the procedure.  For the next 24 hours, DO NOT: -Drive a car -Operate machinery -Drink alcoholic beverages -Take any medication unless instructed by your physician -Make any legal decisions or sign important papers.  Meals: Start with liquid foods such as gelatin or soup. Progress to regular foods as tolerated. Avoid greasy, spicy,   heavy foods. If nausea and/or vomiting occur, drink only clear liquids until the nausea and/or vomiting subsides. Call your physician if vomiting continues.  Special Instructions/Symptoms: Your throat may feel dry or sore from the anesthesia or the breathing tube placed in your throat during surgery. If this causes discomfort, gargle with warm salt water. The discomfort should disappear within 24 hours.  

## 2018-12-08 NOTE — Anesthesia Procedure Notes (Signed)
Procedure Name: LMA Insertion Date/Time: 12/08/2018 10:02 AM Performed by: Mechele Claude, CRNA Pre-anesthesia Checklist: Patient identified, Emergency Drugs available, Suction available and Patient being monitored Patient Re-evaluated:Patient Re-evaluated prior to induction Oxygen Delivery Method: Circle system utilized Preoxygenation: Pre-oxygenation with 100% oxygen Induction Type: IV induction Ventilation: Mask ventilation without difficulty LMA: LMA inserted LMA Size: 4.0 Number of attempts: 1 Airway Equipment and Method: Bite block Placement Confirmation: positive ETCO2 Tube secured with: Tape Dental Injury: Teeth and Oropharynx as per pre-operative assessment

## 2018-12-08 NOTE — Anesthesia Postprocedure Evaluation (Signed)
Anesthesia Post Note  Patient: Sally [name]  Procedure(s) Performed: CYSTOSCOPY/URETEROSCOPY/HOLMIUM LASER/STENT PLACEMENT (Right Ureter)     Patient location during evaluation: PACU Anesthesia Type: General Level of consciousness: awake and alert Pain management: pain level controlled Vital Signs Assessment: post-procedure vital signs reviewed and stable Respiratory status: spontaneous breathing, nonlabored ventilation and respiratory function stable Cardiovascular status: blood pressure returned to baseline and stable Postop Assessment: no apparent nausea or vomiting Anesthetic complications: no    Last Vitals:  Vitals:   12/08/18 1115 12/08/18 1159  BP: (!) 157/76 (!) 170/68  Pulse: 67 64  Resp: 10 14  Temp:  36.4 C  SpO2: 95% 95%    Last Pain:  Vitals:   12/08/18 1159  TempSrc: Oral  PainSc: 0-No pain                 Lidia Collum

## 2018-12-08 NOTE — Transfer of Care (Signed)
Last Vitals:  Vitals Value Taken Time  BP 158/72 12/08/18 1042  Temp    Pulse 77 12/08/18 1044  Resp 16 12/08/18 1044  SpO2 97 % 12/08/18 1044  Vitals shown include unvalidated device data.  Last Pain:  Vitals:   12/08/18 0756  TempSrc: Oral        Immediate Anesthesia Transfer of Care Note  Patient: Sally [name]  Procedure(s) Performed: Procedure(s) (LRB): CYSTOSCOPY/URETEROSCOPY/HOLMIUM LASER/STENT PLACEMENT (Right)  Patient Location: PACU  Anesthesia Type: General  Level of Consciousness: awake, alert  and oriented  Airway & Oxygen Therapy: Patient Spontanous Breathing and Patient connected to nasal cannula oxygen  Post-op Assessment: Report given to PACU RN and Post -op Vital signs reviewed and stable  Post vital signs: Reviewed and stable  Complications: No apparent anesthesia complications

## 2018-12-09 ENCOUNTER — Encounter (HOSPITAL_BASED_OUTPATIENT_CLINIC_OR_DEPARTMENT_OTHER): Payer: Self-pay | Admitting: Urology

## 2018-12-12 DIAGNOSIS — Z23 Encounter for immunization: Secondary | ICD-10-CM | POA: Diagnosis not present

## 2018-12-16 DIAGNOSIS — N201 Calculus of ureter: Secondary | ICD-10-CM | POA: Diagnosis not present

## 2019-01-12 DIAGNOSIS — E1165 Type 2 diabetes mellitus with hyperglycemia: Secondary | ICD-10-CM | POA: Diagnosis not present

## 2019-01-19 DIAGNOSIS — N201 Calculus of ureter: Secondary | ICD-10-CM | POA: Diagnosis not present

## 2019-03-10 DIAGNOSIS — Z794 Long term (current) use of insulin: Secondary | ICD-10-CM | POA: Diagnosis not present

## 2019-03-10 DIAGNOSIS — I10 Essential (primary) hypertension: Secondary | ICD-10-CM | POA: Diagnosis not present

## 2019-03-10 DIAGNOSIS — E785 Hyperlipidemia, unspecified: Secondary | ICD-10-CM | POA: Diagnosis not present

## 2019-03-10 DIAGNOSIS — E119 Type 2 diabetes mellitus without complications: Secondary | ICD-10-CM | POA: Diagnosis not present

## 2019-03-17 DIAGNOSIS — E119 Type 2 diabetes mellitus without complications: Secondary | ICD-10-CM | POA: Diagnosis not present

## 2019-03-17 DIAGNOSIS — I10 Essential (primary) hypertension: Secondary | ICD-10-CM | POA: Diagnosis not present

## 2019-03-17 DIAGNOSIS — E1165 Type 2 diabetes mellitus with hyperglycemia: Secondary | ICD-10-CM | POA: Diagnosis not present

## 2019-03-17 DIAGNOSIS — E785 Hyperlipidemia, unspecified: Secondary | ICD-10-CM | POA: Diagnosis not present

## 2019-04-21 DIAGNOSIS — Z0120 Encounter for dental examination and cleaning without abnormal findings: Secondary | ICD-10-CM | POA: Diagnosis not present

## 2019-05-01 ENCOUNTER — Ambulatory Visit: Payer: Medicare Other | Attending: Internal Medicine

## 2019-05-01 DIAGNOSIS — Z23 Encounter for immunization: Secondary | ICD-10-CM | POA: Insufficient documentation

## 2019-05-01 NOTE — Progress Notes (Signed)
   Covid-19 Vaccination Clinic  Name:  Alahni Varone    MRN: [id number] DOB: Aug 08, 1945  05/01/2019  Ms. Goodbar was observed post Covid-19 immunization for 15 minutes without incidence. She was provided with Vaccine Information Sheet and instruction to access the V-Safe system.   Ms. Lanzo was instructed to call 911 with any severe reactions post vaccine: [name] Kitchen Difficulty breathing  . Swelling of your face and throat  . A fast heartbeat  . A bad rash all over your body  . Dizziness and weakness    Immunizations Administered    Name Date Dose VIS Date Route   Pfizer COVID-19 Vaccine 05/01/2019 10:07 AM 0.3 mL 02/13/2019 Intramuscular   Manufacturer: [employer], [employer]   Lot: [id number]   NDC: [address]-8

## 2019-05-26 ENCOUNTER — Ambulatory Visit: Payer: Medicare Other | Attending: Internal Medicine

## 2019-05-26 DIAGNOSIS — Z23 Encounter for immunization: Secondary | ICD-10-CM

## 2019-05-26 NOTE — Progress Notes (Signed)
   Covid-19 Vaccination Clinic  Name:  Sally [name]    MRN: [id number] DOB: 1945-04-21  05/26/2019  Sally [name] was observed post Covid-19 immunization for 30 minutes based on pre-vaccination screening without incident. She was provided with Vaccine Information Sheet and instruction to access the V-Safe system.   Sally [name] was instructed to call 911 with any severe reactions post vaccine: [name] Kitchen Difficulty breathing  . Swelling of face and throat  . A fast heartbeat  . A bad rash all over body  . Dizziness and weakness   Immunizations Administered    Name Date Dose VIS Date Route   Pfizer COVID-19 Vaccine 05/26/2019 12:14 PM 0.3 mL 02/13/2019 Intramuscular   Manufacturer: [employer], [employer]   Lot: [id number]   NDC: [address]-7

## 2019-05-28 DIAGNOSIS — Z0120 Encounter for dental examination and cleaning without abnormal findings: Secondary | ICD-10-CM | POA: Diagnosis not present

## 2019-07-08 ENCOUNTER — Other Ambulatory Visit: Payer: Self-pay | Admitting: Family Medicine

## 2019-07-08 DIAGNOSIS — Z1231 Encounter for screening mammogram for malignant neoplasm of breast: Secondary | ICD-10-CM

## 2019-07-21 ENCOUNTER — Other Ambulatory Visit: Payer: Self-pay

## 2019-07-21 ENCOUNTER — Ambulatory Visit
Admission: RE | Admit: 2019-07-21 | Discharge: 2019-07-21 | Disposition: A | Discharge status: Home / Self Care | Payer: Medicare Other | Source: Ambulatory Visit | Attending: Family Medicine | Admitting: Family Medicine

## 2019-07-21 DIAGNOSIS — Z1231 Encounter for screening mammogram for malignant neoplasm of breast: Secondary | ICD-10-CM

## 2019-08-05 DIAGNOSIS — H5203 Hypermetropia, bilateral: Secondary | ICD-10-CM | POA: Diagnosis not present

## 2019-08-13 DIAGNOSIS — Z0120 Encounter for dental examination and cleaning without abnormal findings: Secondary | ICD-10-CM | POA: Diagnosis not present

## 2019-08-27 ENCOUNTER — Other Ambulatory Visit (HOSPITAL_COMMUNITY): Payer: Self-pay | Admitting: Family Medicine

## 2019-08-27 DIAGNOSIS — I779 Disorder of arteries and arterioles, unspecified: Secondary | ICD-10-CM

## 2019-08-27 DIAGNOSIS — E7800 Pure hypercholesterolemia, unspecified: Secondary | ICD-10-CM | POA: Diagnosis not present

## 2019-08-27 DIAGNOSIS — Z86010 Personal history of colonic polyps: Secondary | ICD-10-CM | POA: Diagnosis not present

## 2019-08-27 DIAGNOSIS — I10 Essential (primary) hypertension: Secondary | ICD-10-CM | POA: Diagnosis not present

## 2019-08-27 DIAGNOSIS — Z0000 Encounter for general adult medical examination without abnormal findings: Secondary | ICD-10-CM | POA: Diagnosis not present

## 2019-09-03 ENCOUNTER — Other Ambulatory Visit: Payer: Self-pay

## 2019-09-03 ENCOUNTER — Ambulatory Visit (HOSPITAL_COMMUNITY)
Admission: RE | Admit: 2019-09-03 | Discharge: 2019-09-03 | Disposition: A | Discharge status: Home / Self Care | Payer: Medicare Other | Source: Ambulatory Visit | Attending: Family Medicine | Admitting: Family Medicine

## 2019-09-03 DIAGNOSIS — I779 Disorder of arteries and arterioles, unspecified: Secondary | ICD-10-CM | POA: Insufficient documentation

## 2019-10-19 DIAGNOSIS — M25572 Pain in left ankle and joints of left foot: Secondary | ICD-10-CM | POA: Diagnosis not present

## 2019-10-19 DIAGNOSIS — S92335A Nondisplaced fracture of third metatarsal bone, left foot, initial encounter for closed fracture: Secondary | ICD-10-CM | POA: Diagnosis not present

## 2019-10-19 DIAGNOSIS — M25571 Pain in right ankle and joints of right foot: Secondary | ICD-10-CM | POA: Diagnosis not present

## 2019-10-24 DIAGNOSIS — I10 Essential (primary) hypertension: Secondary | ICD-10-CM | POA: Diagnosis not present

## 2019-10-24 DIAGNOSIS — E1165 Type 2 diabetes mellitus with hyperglycemia: Secondary | ICD-10-CM | POA: Diagnosis not present

## 2019-10-24 DIAGNOSIS — E119 Type 2 diabetes mellitus without complications: Secondary | ICD-10-CM | POA: Diagnosis not present

## 2019-10-24 DIAGNOSIS — E785 Hyperlipidemia, unspecified: Secondary | ICD-10-CM | POA: Diagnosis not present

## 2019-11-16 DIAGNOSIS — S92335D Nondisplaced fracture of third metatarsal bone, left foot, subsequent encounter for fracture with routine healing: Secondary | ICD-10-CM | POA: Diagnosis not present

## 2019-11-24 DIAGNOSIS — Z794 Long term (current) use of insulin: Secondary | ICD-10-CM | POA: Diagnosis not present

## 2019-11-24 DIAGNOSIS — E785 Hyperlipidemia, unspecified: Secondary | ICD-10-CM | POA: Diagnosis not present

## 2019-11-24 DIAGNOSIS — E1165 Type 2 diabetes mellitus with hyperglycemia: Secondary | ICD-10-CM | POA: Diagnosis not present

## 2019-11-24 DIAGNOSIS — I10 Essential (primary) hypertension: Secondary | ICD-10-CM | POA: Diagnosis not present

## 2019-11-26 DIAGNOSIS — Z7984 Long term (current) use of oral hypoglycemic drugs: Secondary | ICD-10-CM | POA: Diagnosis not present

## 2019-11-26 DIAGNOSIS — E1165 Type 2 diabetes mellitus with hyperglycemia: Secondary | ICD-10-CM | POA: Diagnosis not present

## 2019-12-01 DIAGNOSIS — Z0120 Encounter for dental examination and cleaning without abnormal findings: Secondary | ICD-10-CM | POA: Diagnosis not present

## 2019-12-03 DIAGNOSIS — J0100 Acute maxillary sinusitis, unspecified: Secondary | ICD-10-CM | POA: Diagnosis not present

## 2019-12-17 DIAGNOSIS — Z23 Encounter for immunization: Secondary | ICD-10-CM | POA: Diagnosis not present

## 2019-12-17 DIAGNOSIS — R350 Frequency of micturition: Secondary | ICD-10-CM | POA: Diagnosis not present

## 2020-01-15 DIAGNOSIS — N200 Calculus of kidney: Secondary | ICD-10-CM | POA: Diagnosis not present

## 2020-02-02 DIAGNOSIS — L309 Dermatitis, unspecified: Secondary | ICD-10-CM | POA: Diagnosis not present

## 2020-02-02 DIAGNOSIS — N762 Acute vulvitis: Secondary | ICD-10-CM | POA: Diagnosis not present

## 2020-02-12 DIAGNOSIS — E785 Hyperlipidemia, unspecified: Secondary | ICD-10-CM | POA: Diagnosis not present

## 2020-02-12 DIAGNOSIS — E7800 Pure hypercholesterolemia, unspecified: Secondary | ICD-10-CM | POA: Diagnosis not present

## 2020-02-12 DIAGNOSIS — E1165 Type 2 diabetes mellitus with hyperglycemia: Secondary | ICD-10-CM | POA: Diagnosis not present

## 2020-02-12 DIAGNOSIS — I10 Essential (primary) hypertension: Secondary | ICD-10-CM | POA: Diagnosis not present

## 2020-02-25 DIAGNOSIS — Z794 Long term (current) use of insulin: Secondary | ICD-10-CM | POA: Diagnosis not present

## 2020-02-25 DIAGNOSIS — E1165 Type 2 diabetes mellitus with hyperglycemia: Secondary | ICD-10-CM | POA: Diagnosis not present

## 2020-02-25 DIAGNOSIS — E785 Hyperlipidemia, unspecified: Secondary | ICD-10-CM | POA: Diagnosis not present

## 2020-02-25 DIAGNOSIS — I10 Essential (primary) hypertension: Secondary | ICD-10-CM | POA: Diagnosis not present

## 2020-04-25 DIAGNOSIS — E785 Hyperlipidemia, unspecified: Secondary | ICD-10-CM | POA: Diagnosis not present

## 2020-04-25 DIAGNOSIS — I10 Essential (primary) hypertension: Secondary | ICD-10-CM | POA: Diagnosis not present

## 2020-04-25 DIAGNOSIS — E7800 Pure hypercholesterolemia, unspecified: Secondary | ICD-10-CM | POA: Diagnosis not present

## 2020-04-25 DIAGNOSIS — E1165 Type 2 diabetes mellitus with hyperglycemia: Secondary | ICD-10-CM | POA: Diagnosis not present

## 2020-05-26 DIAGNOSIS — I10 Essential (primary) hypertension: Secondary | ICD-10-CM | POA: Diagnosis not present

## 2020-05-26 DIAGNOSIS — Z794 Long term (current) use of insulin: Secondary | ICD-10-CM | POA: Diagnosis not present

## 2020-05-26 DIAGNOSIS — E1165 Type 2 diabetes mellitus with hyperglycemia: Secondary | ICD-10-CM | POA: Diagnosis not present

## 2020-05-26 DIAGNOSIS — E785 Hyperlipidemia, unspecified: Secondary | ICD-10-CM | POA: Diagnosis not present

## 2020-06-03 DIAGNOSIS — L309 Dermatitis, unspecified: Secondary | ICD-10-CM | POA: Diagnosis not present

## 2020-06-03 DIAGNOSIS — Z01419 Encounter for gynecological examination (general) (routine) without abnormal findings: Secondary | ICD-10-CM | POA: Diagnosis not present

## 2020-06-17 DIAGNOSIS — E1165 Type 2 diabetes mellitus with hyperglycemia: Secondary | ICD-10-CM | POA: Diagnosis not present

## 2020-06-17 DIAGNOSIS — E785 Hyperlipidemia, unspecified: Secondary | ICD-10-CM | POA: Diagnosis not present

## 2020-06-17 DIAGNOSIS — I10 Essential (primary) hypertension: Secondary | ICD-10-CM | POA: Diagnosis not present

## 2020-06-17 DIAGNOSIS — E7800 Pure hypercholesterolemia, unspecified: Secondary | ICD-10-CM | POA: Diagnosis not present

## 2020-07-14 DIAGNOSIS — N3281 Overactive bladder: Secondary | ICD-10-CM | POA: Diagnosis not present

## 2020-07-14 DIAGNOSIS — N200 Calculus of kidney: Secondary | ICD-10-CM | POA: Diagnosis not present

## 2020-08-05 DIAGNOSIS — L237 Allergic contact dermatitis due to plants, except food: Secondary | ICD-10-CM | POA: Diagnosis not present

## 2020-08-17 ENCOUNTER — Other Ambulatory Visit: Payer: Self-pay | Admitting: Family Medicine

## 2020-08-17 DIAGNOSIS — Z1231 Encounter for screening mammogram for malignant neoplasm of breast: Secondary | ICD-10-CM

## 2020-08-19 ENCOUNTER — Ambulatory Visit
Admission: RE | Admit: 2020-08-19 | Discharge: 2020-08-19 | Disposition: A | Discharge status: Home / Self Care | Payer: Medicare Other | Source: Ambulatory Visit | Attending: Family Medicine | Admitting: Family Medicine

## 2020-08-19 ENCOUNTER — Other Ambulatory Visit: Payer: Self-pay

## 2020-08-19 DIAGNOSIS — Z1231 Encounter for screening mammogram for malignant neoplasm of breast: Secondary | ICD-10-CM | POA: Diagnosis not present

## 2020-08-26 DIAGNOSIS — I10 Essential (primary) hypertension: Secondary | ICD-10-CM | POA: Diagnosis not present

## 2020-08-26 DIAGNOSIS — E785 Hyperlipidemia, unspecified: Secondary | ICD-10-CM | POA: Diagnosis not present

## 2020-08-26 DIAGNOSIS — Z794 Long term (current) use of insulin: Secondary | ICD-10-CM | POA: Diagnosis not present

## 2020-08-26 DIAGNOSIS — E1165 Type 2 diabetes mellitus with hyperglycemia: Secondary | ICD-10-CM | POA: Diagnosis not present

## 2020-09-09 DIAGNOSIS — Z1389 Encounter for screening for other disorder: Secondary | ICD-10-CM | POA: Diagnosis not present

## 2020-09-09 DIAGNOSIS — Z0000 Encounter for general adult medical examination without abnormal findings: Secondary | ICD-10-CM | POA: Diagnosis not present

## 2020-09-15 DIAGNOSIS — E785 Hyperlipidemia, unspecified: Secondary | ICD-10-CM | POA: Diagnosis not present

## 2020-09-15 DIAGNOSIS — E1165 Type 2 diabetes mellitus with hyperglycemia: Secondary | ICD-10-CM | POA: Diagnosis not present

## 2020-09-15 DIAGNOSIS — Z794 Long term (current) use of insulin: Secondary | ICD-10-CM | POA: Diagnosis not present

## 2020-09-15 DIAGNOSIS — E7800 Pure hypercholesterolemia, unspecified: Secondary | ICD-10-CM | POA: Diagnosis not present

## 2020-09-15 DIAGNOSIS — I10 Essential (primary) hypertension: Secondary | ICD-10-CM | POA: Diagnosis not present

## 2020-09-29 DIAGNOSIS — Z794 Long term (current) use of insulin: Secondary | ICD-10-CM | POA: Diagnosis not present

## 2020-09-29 DIAGNOSIS — I779 Disorder of arteries and arterioles, unspecified: Secondary | ICD-10-CM | POA: Diagnosis not present

## 2020-09-29 DIAGNOSIS — I10 Essential (primary) hypertension: Secondary | ICD-10-CM | POA: Diagnosis not present

## 2020-09-29 DIAGNOSIS — E7800 Pure hypercholesterolemia, unspecified: Secondary | ICD-10-CM | POA: Diagnosis not present

## 2020-10-19 DIAGNOSIS — H524 Presbyopia: Secondary | ICD-10-CM | POA: Diagnosis not present

## 2020-11-21 DIAGNOSIS — B49 Unspecified mycosis: Secondary | ICD-10-CM | POA: Diagnosis not present

## 2020-11-25 DIAGNOSIS — E7800 Pure hypercholesterolemia, unspecified: Secondary | ICD-10-CM | POA: Diagnosis not present

## 2020-11-25 DIAGNOSIS — I10 Essential (primary) hypertension: Secondary | ICD-10-CM | POA: Diagnosis not present

## 2020-11-25 DIAGNOSIS — E785 Hyperlipidemia, unspecified: Secondary | ICD-10-CM | POA: Diagnosis not present

## 2020-11-25 DIAGNOSIS — E1165 Type 2 diabetes mellitus with hyperglycemia: Secondary | ICD-10-CM | POA: Diagnosis not present

## 2020-12-10 DIAGNOSIS — Z23 Encounter for immunization: Secondary | ICD-10-CM | POA: Diagnosis not present

## 2021-02-13 DIAGNOSIS — E1165 Type 2 diabetes mellitus with hyperglycemia: Secondary | ICD-10-CM | POA: Diagnosis not present

## 2021-02-13 DIAGNOSIS — I10 Essential (primary) hypertension: Secondary | ICD-10-CM | POA: Diagnosis not present

## 2021-02-13 DIAGNOSIS — Z794 Long term (current) use of insulin: Secondary | ICD-10-CM | POA: Diagnosis not present

## 2021-02-13 DIAGNOSIS — E785 Hyperlipidemia, unspecified: Secondary | ICD-10-CM | POA: Diagnosis not present

## 2021-03-01 DIAGNOSIS — E1165 Type 2 diabetes mellitus with hyperglycemia: Secondary | ICD-10-CM | POA: Diagnosis not present

## 2021-03-01 DIAGNOSIS — Z7984 Long term (current) use of oral hypoglycemic drugs: Secondary | ICD-10-CM | POA: Diagnosis not present

## 2021-03-22 DIAGNOSIS — Z7984 Long term (current) use of oral hypoglycemic drugs: Secondary | ICD-10-CM | POA: Diagnosis not present

## 2021-03-22 DIAGNOSIS — Z5181 Encounter for therapeutic drug level monitoring: Secondary | ICD-10-CM | POA: Diagnosis not present

## 2021-03-22 DIAGNOSIS — I10 Essential (primary) hypertension: Secondary | ICD-10-CM | POA: Diagnosis not present

## 2021-03-22 DIAGNOSIS — E1165 Type 2 diabetes mellitus with hyperglycemia: Secondary | ICD-10-CM | POA: Diagnosis not present

## 2021-05-04 DIAGNOSIS — J3489 Other specified disorders of nose and nasal sinuses: Secondary | ICD-10-CM | POA: Diagnosis not present

## 2021-05-25 DIAGNOSIS — J0100 Acute maxillary sinusitis, unspecified: Secondary | ICD-10-CM | POA: Diagnosis not present

## 2021-06-13 DIAGNOSIS — Z6834 Body mass index (BMI) 34.0-34.9, adult: Secondary | ICD-10-CM | POA: Diagnosis not present

## 2021-06-13 DIAGNOSIS — N8110 Cystocele, unspecified: Secondary | ICD-10-CM | POA: Diagnosis not present

## 2021-06-13 DIAGNOSIS — N816 Rectocele: Secondary | ICD-10-CM | POA: Diagnosis not present

## 2021-08-22 ENCOUNTER — Encounter: Payer: Self-pay | Admitting: Obstetrics and Gynecology

## 2021-08-22 ENCOUNTER — Ambulatory Visit: Payer: Medicare Other | Admitting: Obstetrics and Gynecology

## 2021-08-22 VITALS — BP 156/81 | HR 77 | Ht 62.0 in | Wt 199.0 lb

## 2021-08-22 DIAGNOSIS — N1330 Unspecified hydronephrosis: Secondary | ICD-10-CM

## 2021-08-22 DIAGNOSIS — R339 Retention of urine, unspecified: Secondary | ICD-10-CM

## 2021-08-22 DIAGNOSIS — R350 Frequency of micturition: Secondary | ICD-10-CM

## 2021-08-22 DIAGNOSIS — N816 Rectocele: Secondary | ICD-10-CM | POA: Diagnosis not present

## 2021-08-22 DIAGNOSIS — N393 Stress incontinence (female) (male): Secondary | ICD-10-CM | POA: Diagnosis not present

## 2021-08-22 DIAGNOSIS — N8110 Cystocele, unspecified: Secondary | ICD-10-CM | POA: Diagnosis not present

## 2021-08-22 DIAGNOSIS — N812 Incomplete uterovaginal prolapse: Secondary | ICD-10-CM

## 2021-08-22 LAB — POCT URINALYSIS DIPSTICK
Bilirubin, UA: NEGATIVE
Blood, UA: NEGATIVE
Glucose, UA: POSITIVE — AB
Ketones, UA: POSITIVE
Leukocytes, UA: NEGATIVE
Nitrite, UA: NEGATIVE
Protein, UA: NEGATIVE
Spec Grav, UA: 1.01 (ref 1.010–1.025)
Urobilinogen, UA: 0.2 E.U./dL
pH, UA: 5 (ref 5.0–8.0)

## 2021-08-22 NOTE — Progress Notes (Signed)
[facility] New Patient Evaluation and Consultation  Referring Provider: Jarrett Soho, PA-C PCP: Jarrett Soho, PA-C Date of Service: 08/22/2021  SUBJECTIVE Chief Complaint: New Patient (Initial Visit) (.Sally [name] is a 75 y.o. female here for a consult on prolapse. Pt said she is has stress incontinence. )  History of Present Illness: Sally [name] is a 76 y.o. White or Caucasian female seen in consultation at the request of Jarrett Soho [facility] for evaluation of prolapse.    Review of records from [facility] significant for: Has a cystocele and rectocele that has been worsening over the last month. Has bulging in the vagina and sometimes will retract.   Urinary Symptoms: Leaks urine with cough/ sneeze and with movement to the bathroom Happens rarely, does not happen every day.  Pad use: 1 liners/ mini-pads per day.   She is bothered by her UI symptoms.  Day time voids 5+.  Nocturia: 1-3 times per night to void. Voiding dysfunction: she empties her bladder well.  does not use a catheter to empty bladder.  When urinating, she feels a weak stream and difficulty starting urine stream. Sometimes when she sits on the toilet, only goes small amounts but does not necessarily feel full.   UTIs:  0  UTI's in the last year.   Has history of kidney stones  Pelvic Organ Prolapse Symptoms:                  She Admits to a feeling of a bulge the vaginal area. It has been present for a few months She Admits to seeing a bulge.  This bulge is bothersome. Doesn't always come out but is more often. Goes away with laying down.  Has not had any prior treatment.   Bowel Symptom: Bowel movements: 1+ time(s) per day Stool consistency: soft  Straining: yes.  Splinting: no.  Incomplete evacuation: yes.  She Admits to accidental bowel leakage / fecal incontinence- sometimes will just have mucous Bowel regimen: fiber and stool softener Last colonoscopy:  Date- 5 years ago, Results- a few polyps  Sexual Function Sexually active: yes.  Pain with sex: No  Pelvic Pain Denies pelvic pain   Past Medical History:  Past Medical History:  Diagnosis Date   Allergic rhinitis    Diabetes (HCC)    type 2   High cholesterol    Hypertension    Right ureteral stone      Past Surgical History:   Past Surgical History:  Procedure Laterality Date   CATARACT EXTRACTION Bilateral 2016-2017   colonscopy  2020   polyps removed   CYSTOSCOPY/URETEROSCOPY/HOLMIUM LASER/STENT PLACEMENT Right 12/08/2018   Procedure: CYSTOSCOPY/URETEROSCOPY/HOLMIUM LASER/STENT PLACEMENT;  Surgeon: Crista Elliot, MD;  [facility];  Service: Urology;  Laterality: Right;   TUBAL LIGATION       Past OB/GYN History: OB History  Gravida Para Term Preterm AB Living  2         2  SAB IAB Ectopic Multiple Live Births          2    # Outcome Date GA Lbr Len/2nd Weight Sex Delivery Anes PTL Lv  2 Gravida           1 Gravida             Vaginal deliveries: 2,  Forceps/ Vacuum deliveries: 0, Cesarean section: 0 Menopausal: Denies vaginal bleeding since menopause Any history of abnormal pap smears: no.   Medications: She has  a current medication list which includes the following prescription(s): acetaminophen, bepotastine besilate, cetirizine, vitamin d3, cyanocobalamin, jardiance, fluticasone, humalog kwikpen, lantus solostar, metformin, ondansetron, oxycodone-acetaminophen, rosuvastatin, and UNABLE TO FIND.   Allergies: Patient is allergic to codeine, lipitor [atorvastatin], and zocor [simvastatin].   Social History:  Social History   Tobacco Use   Smoking status: Never   Smokeless tobacco: Never  Vaping Use   Vaping Use: Never used  Substance Use Topics   Alcohol use: Yes    Comment: seldom   Drug use: Never    Relationship status: married She lives with husband.   She is not employed. Regular exercise: No History of  abuse: No  Family History:   Family History  Problem Relation Age of Onset   Cancer Mother    Heart attack Father    Allergic rhinitis Neg Hx    Asthma Neg Hx    Breast cancer Neg Hx      Review of Systems: Review of Systems  Constitutional:  Negative for fever, malaise/fatigue and weight loss.  Respiratory:  Negative for cough, shortness of breath and wheezing.   Cardiovascular:  Negative for chest pain, palpitations and leg swelling.  Gastrointestinal:  Negative for abdominal pain and blood in stool.  Genitourinary:  Negative for dysuria.  Musculoskeletal:  Negative for myalgias.  Skin:  Negative for rash.  Neurological:  Negative for dizziness and headaches.  Endo/Heme/Allergies:  Bruises/bleeds easily.  Psychiatric/Behavioral:  Negative for depression. The patient is not nervous/anxious.      OBJECTIVE Physical Exam: Vitals:   08/22/21 1319  BP: (!) 156/81  Pulse: 77  Weight: 199 lb (90.3 kg)  Height: 5\' 2"  (1.575 m)    Physical Exam Constitutional:      General: She is not in acute distress. Pulmonary:     Effort: Pulmonary effort is normal.  Abdominal:     General: There is no distension.     Palpations: Abdomen is soft.     Tenderness: There is no abdominal tenderness. There is no rebound.  Musculoskeletal:        General: No swelling. Normal range of motion.  Skin:    General: Skin is warm and dry.     Findings: No rash.  Neurological:     Mental Status: She is alert and oriented to person, place, and time.  Psychiatric:        Mood and Affect: Mood normal.        Behavior: Behavior normal.    GU / Detailed Urogynecologic Evaluation:  Pelvic Exam: Normal external female genitalia; Bartholin's and Skene's glands normal in appearance; urethral meatus normal in appearance, no urethral masses or discharge.   CST: negative  Speculum exam reveals normal vaginal mucosa without atrophy. Cervix normal appearance. Uterus normal single, nontender. Adnexa no  mass, fullness, tenderness.     Pelvic floor strength I/V  Pelvic floor musculature: Right levator non-tender, Right obturator non-tender, Left levator non-tender, Left obturator non-tender  POP-Q:   POP-Q  2                                            Aa   2  Ba  -4                                              C   4.5                                            Gh  3.5                                            Pb  9                                            tvl   1                                            Ap  1                                            Bp  -7                                              D     Rectal Exam:  Normal external rectum  Post-Void Residual (PVR) by Bladder Scan: In order to evaluate bladder emptying, we discussed obtaining a postvoid residual and she agreed to this procedure.  Procedure: The ultrasound unit was placed on the patient's abdomen in the suprapubic region after the patient had voided. A PVR of 304 ml was obtained by bladder scan.  Straight catheterization performed to confirm, PVR with catheterization is .   Laboratory Results: POC urine: + glucose, otherwise negative   ASSESSMENT AND PLAN Ms. Rossin is a 76 y.o. with:  1. Prolapse of anterior vaginal wall   2. Prolapse of posterior vaginal wall   3. Uterovaginal prolapse, incomplete   4. Hydronephrosis, unspecified hydronephrosis type   5. Urinary frequency   6. Incomplete bladder emptying   7. SUI (stress urinary incontinence, female)    Stage III anterior, Stage II posterior, Stage I apical prolapse - For treatment of pelvic organ prolapse, we discussed options for management including expectant management, conservative management, and surgical management, such as Kegels, a pessary, pelvic floor physical therapy, and specific surgical procedures. - She reports that her most recent A1c > 8%, therefore would not be a  good candidate for surgery.  - She wants to try a pessary and will return for a fitting.   2. Incomplete bladder emptying - PVR - Unclear cause- prolapse vs neurologic - Will plan to reassess emptying after pessary placement. Will consider urodynamics - Need to assess for hydronephrosis due to incomplete emptying- renal US ordered - Needs Renal function testing. Having  labs done with endocrinologist last week, will request if test can be added.   3. SUI - can address with pessary  Jaquita Folds, MD

## 2021-08-24 ENCOUNTER — Encounter: Payer: Self-pay | Admitting: Obstetrics and Gynecology

## 2021-08-24 ENCOUNTER — Ambulatory Visit: Payer: Medicare Other | Admitting: Obstetrics and Gynecology

## 2021-08-24 VITALS — BP 166/87 | HR 76

## 2021-08-24 DIAGNOSIS — N8110 Cystocele, unspecified: Secondary | ICD-10-CM | POA: Diagnosis not present

## 2021-08-24 DIAGNOSIS — R339 Retention of urine, unspecified: Secondary | ICD-10-CM

## 2021-08-24 NOTE — Progress Notes (Signed)
[facility]   Subjective:     Chief Complaint: Pessary fitting  History of Present Illness: Sally [name] is a 76 y.o. female with stage II pelvic organ prolapse and incomplete bladder emptying who presents today for a pessary fitting.    Past Medical History: Patient  has a past medical history of Allergic rhinitis, Diabetes (HCC), High cholesterol, Hypertension, and Right ureteral stone.   Past Surgical History: She  has a past surgical history that includes Cataract extraction (Bilateral, 2016-2017); colonscopy (2020); Cystoscopy/ureteroscopy/holmium laser/stent placement (Right, 12/08/2018); and Tubal ligation.   Medications: She has a current medication list which includes the following prescription(s): acetaminophen, bepotastine besilate, cetirizine, vitamin d3, cyanocobalamin, jardiance, fluticasone, humalog kwikpen, lantus solostar, metformin, ondansetron, oxycodone-acetaminophen, rosuvastatin, and UNABLE TO FIND.   Allergies: Patient is allergic to codeine, lipitor [atorvastatin], and zocor [simvastatin].   Social History: Patient  reports that she has never smoked. She has never used smokeless tobacco. She reports current alcohol use. She reports that she does not use drugs.      Objective:    BP (!) 166/87   Pulse 76  Gen: No apparent distress, A&O x 3. Pelvic Exam: Normal external female genitalia; Bartholin's and Skene's glands normal in appearance; urethral meatus normal in appearance, no urethral masses or discharge.   A size #3 ring with support pessary was fitted but there was too much space present. This was removed and replaced with a #4 ring with support. It was comfortable, stayed in place with valsalva, standing and bending and was an appropriate size on examination, with one finger fitting between the pessary and the vaginal walls. The patient demonstrated proper removal and replacement.   POP-Q:    POP-Q   2                                             Aa   2                                           Ba   -4                                              C    4.5                                            Gh   3.5                                            Pb   9                                            tvl    1  Ap   1                                            Bp   -7                                              D        Assessment/Plan:    Assessment: Sally [name] is a 76 y.o. with stage II pelvic organ prolapse and incomplete bladder emptying who presents for a pessary fitting.  Plan: - She was fitted with a #4 ring with support pessary.  - She will remove at least weekly and wash with soap and water .  - Advised to use lubricant as needed - At next visit, will repeat PVR to assess bladder emptying. If not improved, then will need to return for urodynamic testing.   Follow-up in 3 weeks for a pessary check or sooner as needed.  All questions were answered.    Sally Beards, MD

## 2021-08-24 NOTE — Patient Instructions (Signed)
You were fitted with a #4 ring pessary.  Come back in in a few weeks to see how it is working.  You can take it out every week, or sooner if you desire. Clean with liquid soap and water in between uses and leave out over night on the night that you remove it. Call for any problems.  If this does not improve your bladder emptying, we will proceed with Urodynamic testing.

## 2021-08-25 ENCOUNTER — Ambulatory Visit (HOSPITAL_COMMUNITY)
Admission: RE | Admit: 2021-08-25 | Discharge: 2021-08-25 | Disposition: A | Discharge status: Home / Self Care | Payer: Medicare Other | Source: Ambulatory Visit | Attending: Obstetrics and Gynecology | Admitting: Obstetrics and Gynecology

## 2021-08-25 DIAGNOSIS — N1330 Unspecified hydronephrosis: Secondary | ICD-10-CM

## 2021-08-25 DIAGNOSIS — R339 Retention of urine, unspecified: Secondary | ICD-10-CM | POA: Diagnosis not present

## 2021-08-28 DIAGNOSIS — E1165 Type 2 diabetes mellitus with hyperglycemia: Secondary | ICD-10-CM | POA: Diagnosis not present

## 2021-08-28 DIAGNOSIS — Z794 Long term (current) use of insulin: Secondary | ICD-10-CM | POA: Diagnosis not present

## 2021-08-28 DIAGNOSIS — I10 Essential (primary) hypertension: Secondary | ICD-10-CM | POA: Diagnosis not present

## 2021-08-28 DIAGNOSIS — E785 Hyperlipidemia, unspecified: Secondary | ICD-10-CM | POA: Diagnosis not present

## 2021-09-12 DIAGNOSIS — E1165 Type 2 diabetes mellitus with hyperglycemia: Secondary | ICD-10-CM | POA: Diagnosis not present

## 2021-09-12 DIAGNOSIS — E785 Hyperlipidemia, unspecified: Secondary | ICD-10-CM | POA: Diagnosis not present

## 2021-09-15 ENCOUNTER — Other Ambulatory Visit: Payer: Self-pay | Admitting: Family Medicine

## 2021-09-15 DIAGNOSIS — Z1231 Encounter for screening mammogram for malignant neoplasm of breast: Secondary | ICD-10-CM

## 2021-09-19 DIAGNOSIS — E1165 Type 2 diabetes mellitus with hyperglycemia: Secondary | ICD-10-CM | POA: Diagnosis not present

## 2021-09-19 DIAGNOSIS — I10 Essential (primary) hypertension: Secondary | ICD-10-CM | POA: Diagnosis not present

## 2021-09-19 DIAGNOSIS — Z23 Encounter for immunization: Secondary | ICD-10-CM | POA: Diagnosis not present

## 2021-09-19 DIAGNOSIS — Z794 Long term (current) use of insulin: Secondary | ICD-10-CM | POA: Diagnosis not present

## 2021-09-19 DIAGNOSIS — Z0000 Encounter for general adult medical examination without abnormal findings: Secondary | ICD-10-CM | POA: Diagnosis not present

## 2021-09-26 ENCOUNTER — Ambulatory Visit
Admission: RE | Admit: 2021-09-26 | Discharge: 2021-09-26 | Disposition: A | Discharge status: Home / Self Care | Payer: Medicare Other | Source: Ambulatory Visit | Attending: Family Medicine | Admitting: Family Medicine

## 2021-09-26 DIAGNOSIS — Z1231 Encounter for screening mammogram for malignant neoplasm of breast: Secondary | ICD-10-CM

## 2021-09-29 ENCOUNTER — Encounter: Payer: Self-pay | Admitting: Obstetrics and Gynecology

## 2021-09-29 ENCOUNTER — Ambulatory Visit: Payer: Medicare Other | Admitting: Obstetrics and Gynecology

## 2021-09-29 VITALS — BP 151/65 | HR 76

## 2021-09-29 DIAGNOSIS — R339 Retention of urine, unspecified: Secondary | ICD-10-CM

## 2021-09-29 DIAGNOSIS — N8110 Cystocele, unspecified: Secondary | ICD-10-CM | POA: Diagnosis not present

## 2021-09-29 NOTE — Progress Notes (Signed)
[facility]   Subjective:     Chief Complaint:  Chief Complaint  Patient presents with   Pessary Check   History of Present Illness: Sally [name] is a 76 y.o. female with stage III pelvic organ prolapse and incomplete bladder emptying who presents for a pessary check. She is using a size #4 ring with support pessary. The pessary kept falling out, has only been able to wear it about an hour at a time. She has been voiding well. Still has some hesitancy later in the day and will get less urine out.   Renal US 08/25/21: IMPRESSION: 1. Mild RIGHT-sided pelviectasis without frank hydronephrosis. If persistent concern for obstruction, recommend dedicated cross-sectional imaging.  Received recent blood work from her PCP- Cr 0.66, Hgb A1c 8.7%  Past Medical History: Patient  has a past medical history of Allergic rhinitis, Diabetes (HCC), High cholesterol, Hypertension, and Right ureteral stone.   Past Surgical History: She  has a past surgical history that includes Cataract extraction (Bilateral, 2016-2017); colonscopy (2020); Cystoscopy/ureteroscopy/holmium laser/stent placement (Right, 12/08/2018); and Tubal ligation.   Medications: She has a current medication list which includes the following prescription(s): acetaminophen, bepotastine besilate, cetirizine, vitamin d3, cyanocobalamin, jardiance, fluticasone, humalog kwikpen, lantus solostar, metformin, ondansetron, oxycodone-acetaminophen, rosuvastatin, and UNABLE TO FIND.   Allergies: Patient is allergic to codeine, lipitor [atorvastatin], and zocor [simvastatin].   Social History: Patient  reports that she has never smoked. She has never used smokeless tobacco. She reports current alcohol use. She reports that she does not use drugs.      Objective:    Physical Exam: BP (!) 151/65   Pulse 76  Gen: No apparent distress, A&O x 3.\  Detailed Urogynecologic Evaluation:  Pelvic Exam: Normal external  female genitalia; Bartholin's and Skene's glands normal in appearance; urethral meatus normal in appearance, no urethral masses or discharge.   A #5 ring with support pessary was placed. It was comfortable and fit well but fell out with standing and walking. Placed a #3 cube but this was too small. Placed a #4 cube.  It was comfortable, fit well, and stayed in placed with strong cough, valsalva and bending. The patient demonstrated proper placement and removal. Lot # [id number]    POP-Q:    POP-Q   2                                            Aa   2                                           Ba   -4                                              C    4.5                                            Gh   3.5  Pb   9                                            tvl    1                                            Ap   1                                            Bp   -7                                              D      PVR was rechecked with bladder scanner:   Assessment/Plan:    Assessment: Ms. Swim is a 76 y.o. with stage III pelvic organ prolapse  Plan: - Renal US and Cr were reassuring. PVR has improved but not using pessary regularly.  - #4 cube pessary placed today.  - She will remove at least weekly or as needed .  - She will follow-up in 3 weeks for a pessary check or sooner as needed.   Marguerita Beards, MD

## 2021-10-06 ENCOUNTER — Telehealth: Payer: Self-pay | Admitting: Obstetrics and Gynecology

## 2021-10-18 IMAGING — MG MM DIGITAL SCREENING BILAT W/ TOMO AND CAD
6 of 10 series · 6 of 30 positions shown · non-contrast
Comparison: Previous exam(s).

CLINICAL DATA: Screening.

EXAM:
DIGITAL SCREENING BILATERAL MAMMOGRAM WITH TOMOSYNTHESIS AND CAD
TECHNIQUE: Bilateral screening digital craniocaudal and mediolateral oblique
mammograms were obtained. Bilateral screening digital breast
tomosynthesis was performed. The images were evaluated with
computer-aided detection.

[L CC synth-2D (1 of 2)]
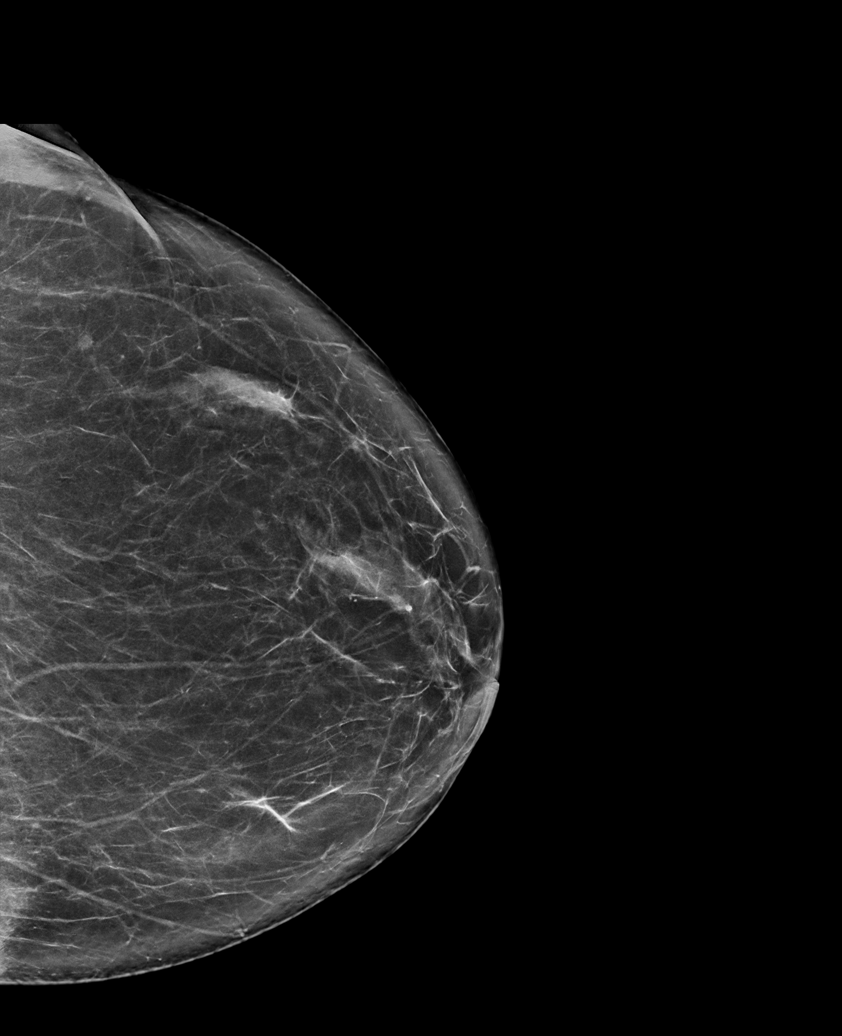

[L MLO synth-2D]
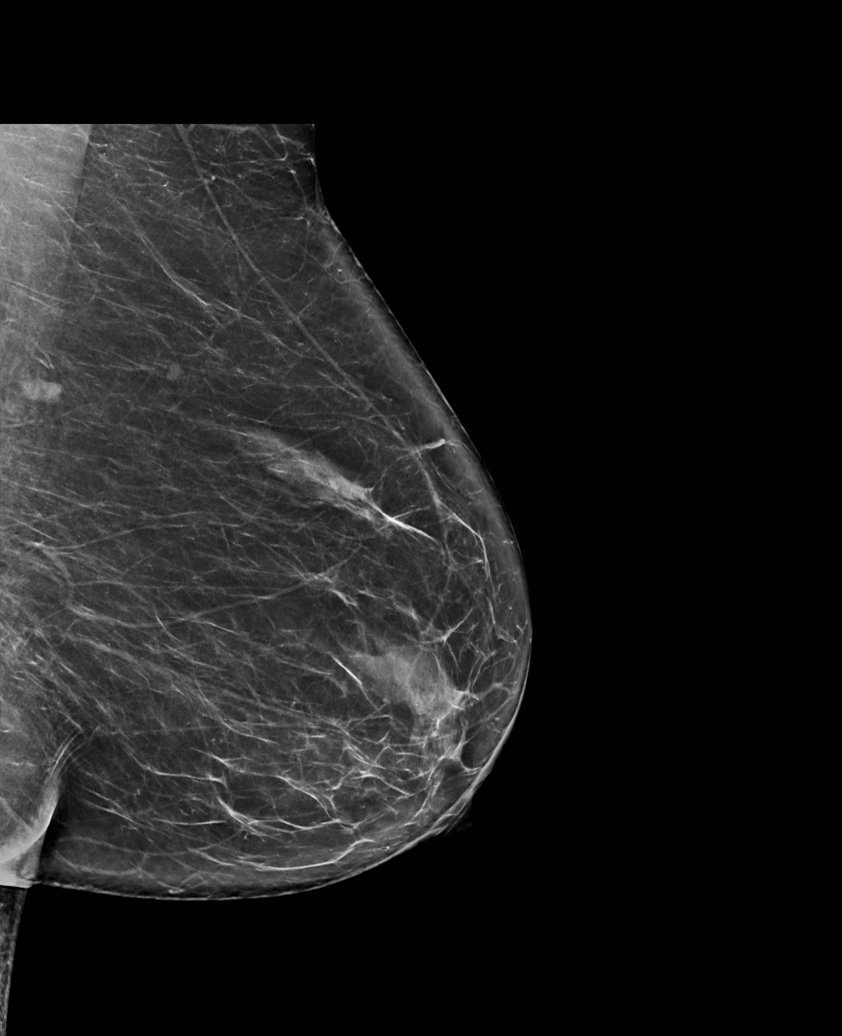

[R CC synth-2D]
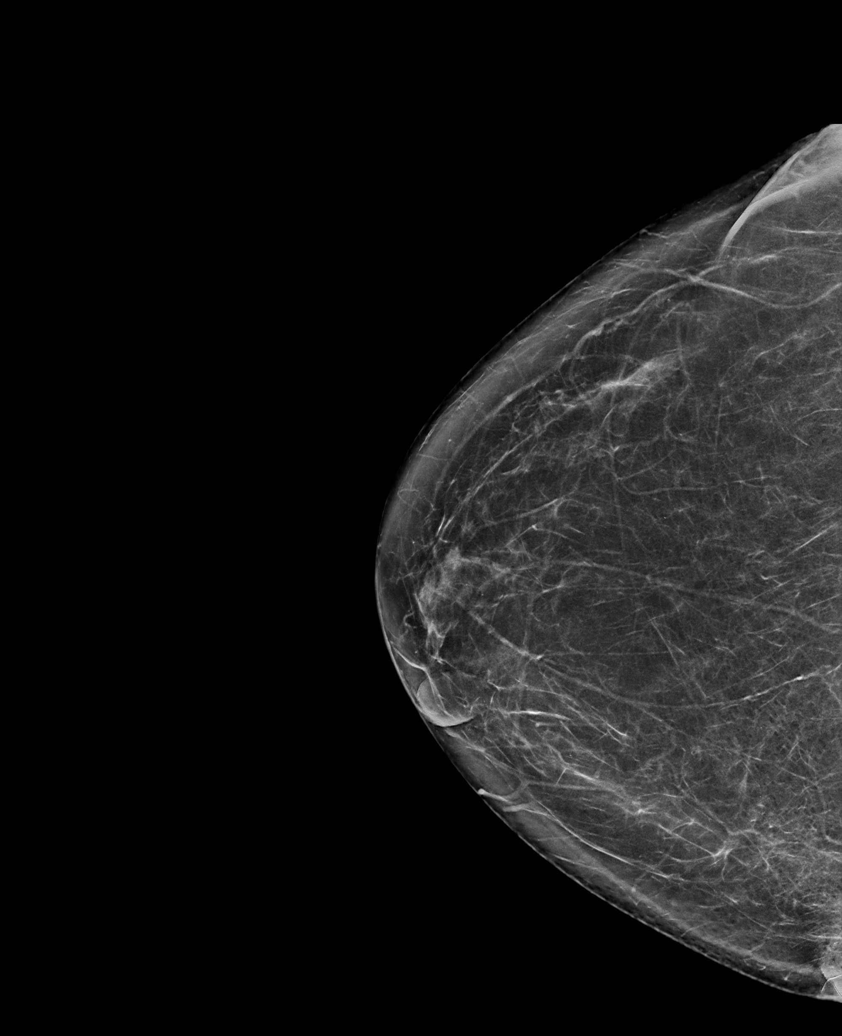

[R MLO synth-2D]
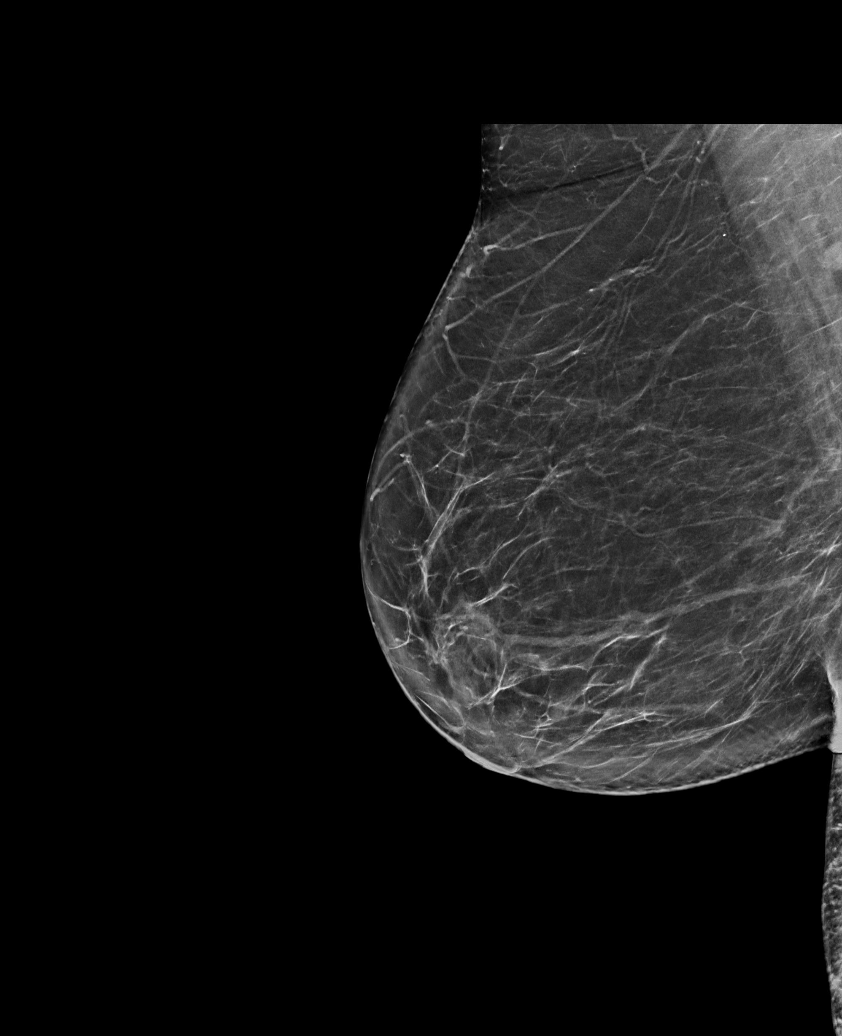

[L CC synth-2D (2 of 2)]
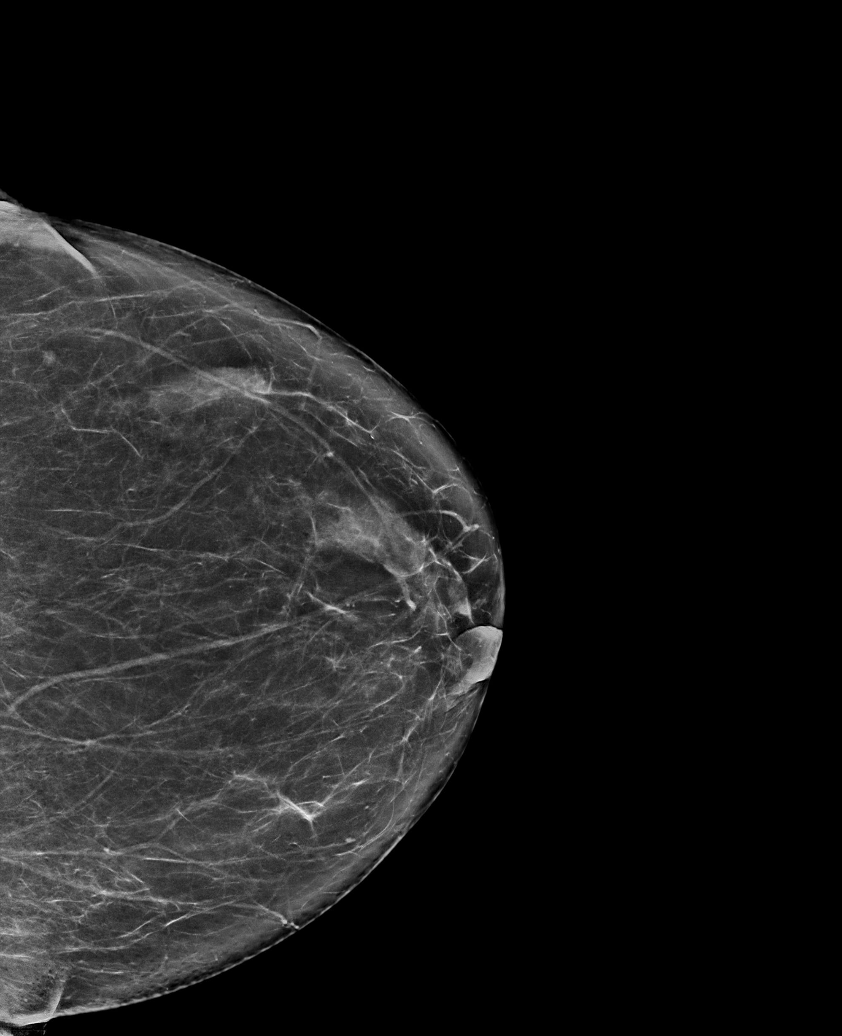

[L MLO tomo · tomo slice 43/85.0]
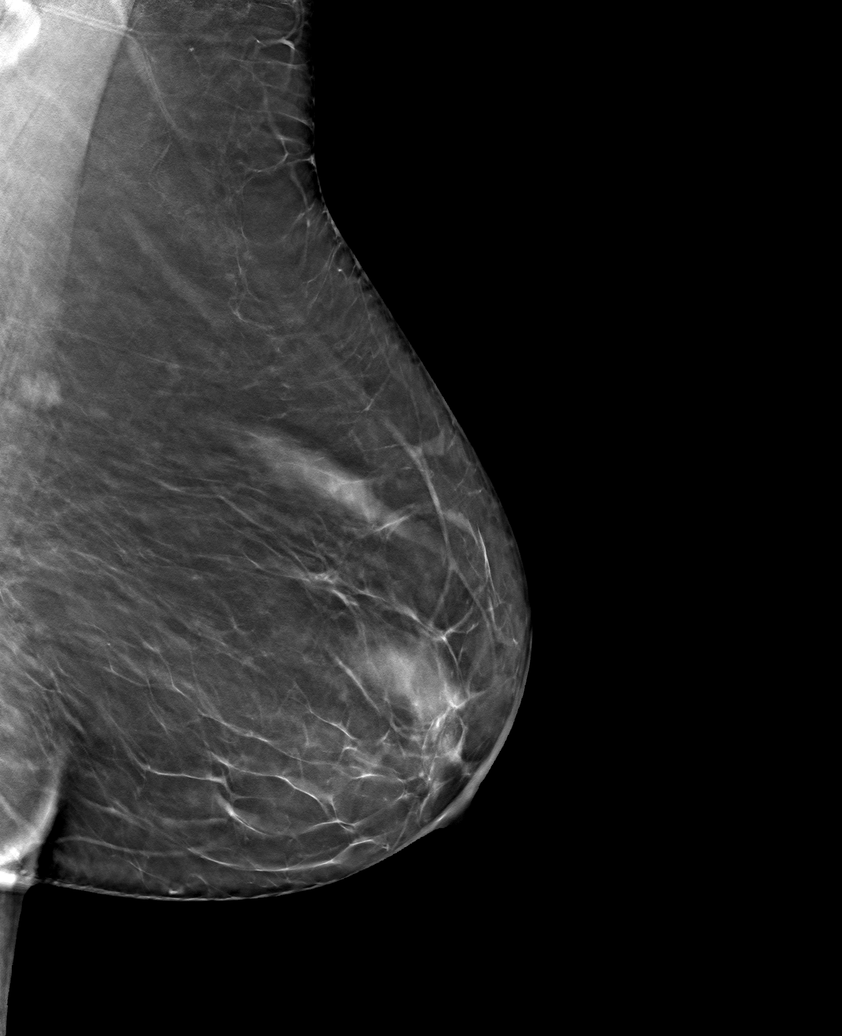

[6 of 30 positions shown; findings below may reference images not displayed]

ACR Breast Density Category b: There are scattered areas of
fibroglandular density.
FINDINGS: There are no findings suspicious for malignancy.
IMPRESSION: No mammographic evidence of malignancy. A result letter of this
screening mammogram will be mailed directly to the patient.

RECOMMENDATION:
Screening mammogram in one year. (Code:51-O-LD2)

BI-RADS CATEGORY  1: Negative.

## 2021-10-19 DIAGNOSIS — L255 Unspecified contact dermatitis due to plants, except food: Secondary | ICD-10-CM | POA: Diagnosis not present

## 2021-10-20 ENCOUNTER — Encounter: Payer: Self-pay | Admitting: Obstetrics and Gynecology

## 2021-10-20 ENCOUNTER — Ambulatory Visit: Payer: Medicare Other | Admitting: Obstetrics and Gynecology

## 2021-10-20 VITALS — BP 164/74 | HR 75

## 2021-10-20 DIAGNOSIS — N3941 Urge incontinence: Secondary | ICD-10-CM

## 2021-10-20 DIAGNOSIS — N952 Postmenopausal atrophic vaginitis: Secondary | ICD-10-CM | POA: Diagnosis not present

## 2021-10-20 MED ORDER — ESTRADIOL 0.1 MG/GM VA CREA: TOPICAL_CREAM | VAGINAL | 11 refills | Status: AC

## 2021-10-20 NOTE — Progress Notes (Signed)
[facility]   Subjective:     Chief Complaint:  Chief Complaint  Patient presents with   Pessary Check   History of Present Illness: Sally [name] is a 76 y.o. female with stage III pelvic organ prolapse and incomplete bladder emptying who presents for a pessary check. She is using a size #4 cube pessary. The pessary has been working well for her. It has stayed in place and she removes it once a week. She noticed some vaginal bleeding with the first removal but has not had any since. Feels that she is emptying her bladder a lot better but she is leaking throughout the day. She does not really notice the leakage but her pad is damp throughout the day.   Past Medical History: Patient  has a past medical history of Allergic rhinitis, Diabetes (HCC), High cholesterol, Hypertension, and Right ureteral stone.   Past Surgical History: She  has a past surgical history that includes Cataract extraction (Bilateral, 2016-2017); colonscopy (2020); Cystoscopy/ureteroscopy/holmium laser/stent placement (Right, 12/08/2018); and Tubal ligation.   Medications: She has a current medication list which includes the following prescription(s): acetaminophen, bepotastine besilate, cetirizine, vitamin d3, cyanocobalamin, jardiance, fluticasone, humalog kwikpen, lantus solostar, metformin, ondansetron, oxycodone-acetaminophen, rosuvastatin, and UNABLE TO FIND.   Allergies: Patient is allergic to codeine, lipitor [atorvastatin], and zocor [simvastatin].   Social History: Patient  reports that she has never smoked. She has never used smokeless tobacco. She reports current alcohol use. She reports that she does not use drugs.      Objective:    Physical Exam: BP (!) 164/74   Pulse 75  Gen: No apparent distress, A&O x 3.\  Detailed Urogynecologic Evaluation:  Pelvic Exam: Normal external female genitalia; Bartholin's and Skene's glands normal in appearance; urethral meatus normal in  appearance, no urethral masses or discharge.   The pessary was removed and cleaned. Speculum exam revealed no lesions in the vagina. The pessary was replaced and was comfortable.    POP-Q:    POP-Q   2                                            Aa   2                                           Ba   -4                                              C    4.5                                            Gh   3.5                                            Pb   9  tvl    1                                            Ap   1                                            Bp   -7                                              D      PVR was rechecked with bladder scanner: 62ml  Assessment/Plan:    Assessment: Sally [name] is a 76 y.o. with stage III pelvic organ prolapse  Plan: - Normal bladder emptying now with the pessary.  - Continue #4 cube pessary - start vaginal estrogen cream- Estrace 0.5g nightly for two weeks then twice a week after.  - Unclear cause of incontinence (stress vs urge)- discussed option of medication but she wants to avoid for now. Will start with pelvic floor PT, referral placed.   Marguerita Beards, MD

## 2021-10-20 NOTE — Patient Instructions (Addendum)
Constipation: Our goal is to achieve formed bowel movements daily or every-other-day.  You may need to try different combinations of the following options to find what works best for you - everybody's body works differently so feel free to adjust the dosages as needed.  Some options to help maintain bowel health include:  Dietary changes (more leafy greens, vegetables and fruits; less processed foods) Fiber supplementation (Benefiber, FiberCon, Metamucil or Psyllium). Start slow and increase gradually to full dose. Over-the-counter agents such as: stool softeners (Docusate or Colace) and/or laxatives (Miralax, milk of magnesia). Miralax is a powder and you can take a partial dose if needed.  "Power Pudding" is a natural mixture that may help your constipation.  To make blend 1 cup applesauce, 1 cup wheat bran, and 3/4 cup prune juice, refrigerate and then take 1 tablespoon daily with a large glass of water as needed.  Start vaginal estrogen therapy nightly for two weeks then 2 times weekly at night for treatment of vaginal atrophy (dryness of the vaginal tissues).  Please let us know if the prescription is too expensive and we can look for alternative options.

## 2021-10-24 DIAGNOSIS — H5203 Hypermetropia, bilateral: Secondary | ICD-10-CM | POA: Diagnosis not present

## 2021-10-25 NOTE — Therapy (Addendum)
OUTPATIENT PHYSICAL THERAPY FEMALE PELVIC EVALUATION   Patient Name: Sally [name] MRN: [id number] DOB:Oct 28, 1945, 76 y.o., female Today's Date: 10/25/2021    1701  PT Visits / Re-Eval   Visit Number 1  Number of Visits --  Date for PT Re-Evaluation 01/18/2022  Authorization   Authorization Type BC[employer] medicare  Authorization Time Period --  Authorization - Visit Number 1  Authorization - Number of Visits 10  Progress Note Due on Visit --  PT Time Calculation   PT Start Time 1545  PT Stop Time 1645  PT Time Calculation (min) 60 min  PT - End of Session   Equipment Utilized During Treatment --  Activity Tolerance Patient tolerated treatment well  Behavior During Therapy [facility] for tasks assessed/performed  Eulis Foster, PT 11/27/21 9:34 AM   Past Medical History:  Diagnosis Date   Allergic rhinitis    Diabetes (HCC)    type 2   High cholesterol    Hypertension    Right ureteral stone    Past Surgical History:  Procedure Laterality Date   CATARACT EXTRACTION Bilateral 2016-2017   colonscopy  2020   polyps removed   CYSTOSCOPY/URETEROSCOPY/HOLMIUM LASER/STENT PLACEMENT Right 12/08/2018   Procedure: CYSTOSCOPY/URETEROSCOPY/HOLMIUM LASER/STENT PLACEMENT;  Surgeon: Crista Elliot, MD;  Location: [facility];  Service: Urology;  Laterality: Right;   TUBAL LIGATION     There are no problems to display for this patient.   PCP: Jarrett Soho, PA-C  REFERRING PROVIDER: Marguerita Beards, MD  REFERRING DIAG: N39.41 (ICD-10-CM) - Urge incontinence  THERAPY DIAG:  No diagnosis found.  Rationale for Evaluation and Treatment Rehabilitation  ONSET DATE: 04/2021  SUBJECTIVE:                                                                                                                                                                                           SUBJECTIVE STATEMENT: 04/2021 her bladder started to come out. Patient has the  cube that fits and it works and helps empty the bladder all the time. Patients having more leakage with the cube.  Fluid intake: Yes: water     PAIN:  Are you having pain? No  PRECAUTIONS: None  WEIGHT BEARING RESTRICTIONS No  FALLS:  Has patient fallen in last 6 months? No  LIVING ENVIRONMENT: Lives with: lives with their spouse   OCCUPATION: retired   PLOF: Independent  PATIENT GOALS reduce urinary leakage.   PERTINENT HISTORY:  Diabetes; Hypertension  BOWEL MOVEMENT Pain with bowel movement: No Type of bowel movement:Type (Bristol Stool Scale) type 1 or 4, Frequency 1-2 days, Strain Yes, and Splinting no  Fully empty rectum: No Leakage: Yes: sometimes a hard piece with come out randomly Fiber supplement: Yes: citrucel  URINATION Pain with urination: No Fully empty bladder: Yes: with pessary Stream: Strong Urgency: Yes: must go when she gets up to go Frequency: Day time voids 5+.  Nocturia: 1-3 times per night to void. Leakage: Walking to the bathroom, Coughing, and Sneezing Pads: Yes: 1 liner per day  INTERCOURSE Pain with intercourse:  no pain or issues  PREGNANCY Vaginal deliveries 2  PROLAPSE Anterior wall stage III pelvic organ prolapse; #4 cube pessary; rectocele stage II; Stage 1 Apical    OBJECTIVE:   DIAGNOSTIC FINDINGS:  Pelvic floor strength I/V; Pelvic floor musculature:  PVR of 304 ml was obtained by bladder scan.Straight catheterization performed to confirm, PVR with catheterization is   COGNITION:  Overall cognitive status: Within functional limits for tasks assessed     SENSATION:  Light touch: Appears intact  Proprioception: Appears intact                POSTURE: No Significant postural limitations   PELVIC ALIGNMENT:  LUMBARAROM/PROM  A/PROM A/PROM  eval  Flexion Decreased by 25%  Extension Decreased by 25%  Right lateral flexion Decreased by 25%  Left lateral flexion Decreased by 25%  Right rotation Decreased  by 25%  Left rotation Decreased by 25%   (Blank rows = not tested)  LOWER EXTREMITY ROM:  Passive ROM Right eval Left eval  Hip internal rotation 25 25  Hip external rotation 50 60   (Blank rows = not tested)  LOWER EXTREMITY MMT:  MMT Right eval Left eval  Hip flexion  4/5  Hip abduction 4-/5 4/5    PALPATION:                 External Perineal Exam redness long the labias                             Internal Pelvic Floor tightness along the introitus  Patient confirms identification and approves PT to assess internal pelvic floor and treatment Yes  PELVIC MMT:   MMT eval  Vaginal 1/5 but after manual work to the sides of the introitus was 3/5 holding 5 sec 3 times  (Blank rows = not tested)        TONE: Increased around the introitus  PROLAPSE: Patient has the cube inserted  TODAY'S TREATMENT  EVAL Date: 10/26/2021 HEP established-see below    PATIENT EDUCATION:  10/26/2021 Education details: Access Code: [email or web address], education on incontinence pads Person educated: Patient Education method: Explanation, Demonstration, Tactile cues, Verbal cues, and Handouts Education comprehension: verbalized understanding, returned demonstration, verbal cues required, tactile cues required, and needs further education   HOME EXERCISE PROGRAM: 10/26/2021 Access Code: [email or web address] URL: [email or web address] Date: 10/26/2021 Prepared by: Eulis Foster  Exercises - Hooklying Transversus Abdominis Palpation  - 1 x daily - 7 x weekly - 1 sets - 10 reps - Seated Pelvic Floor Contraction  - 3-4 x daily - 7 x weekly - 1 sets - 5 reps - 5 sec hold - Seated Quick Flick Pelvic Floor Contractions  - 3-4 x daily - 7 x weekly - 1 sets - 5 reps  ASSESSMENT:  CLINICAL IMPRESSION: Patient is a 76 y.o. female who was seen today for physical therapy evaluation and treatment for urge incontinence.Patient reports her  pessary is helping with the prolapse but now she is leaking more  urine. Pelvic  floor strength was 1/5 but after therapist worked on the introitus the strength increased to 3/5. She leaks with walking to the bathroom, coughing and sneezing. She wears 1 panty liner per day. Patient has redness on the labia minora and majora. Therapist is having patient see if her pads are for incontinence or menstrual because that could be causing the redness. Patient lumbar ROM is limited by 25%. She has weakness in hip abductors. She has trouble contracting her lower abdominals. Patient will benefit from skilled therapy to improve pelvic floor strength and coordination to reduce her leakage.   OBJECTIVE IMPAIRMENTS decreased coordination, decreased endurance, decreased strength, and increased fascial restrictions.   ACTIVITY LIMITATIONS continence  PARTICIPATION LIMITATIONS: community activity  PERSONAL FACTORS Fitness and 1-2 comorbidities:    Diabetes; Hypertension are also affecting patient's functional outcome.   REHAB POTENTIAL: Excellent  CLINICAL DECISION MAKING: Stable/uncomplicated  EVALUATION COMPLEXITY: Low   GOALS: Goals reviewed with patient? Yes  SHORT TERM GOALS: Target date: 11/23/2021  Patient is able to contract the pelvic floor with a circular contraction.  Baseline: Goal status: INITIAL  2.  Patient understands the difference between incontinence pads and menstrual pads.  Baseline:  Goal status: INITIAL   LONG TERM GOALS: Target date: 01/18/2022   Patient is independent with her HEP for pelvic floor and core strength.  Baseline:  Goal status: INITIAL  2.  Patient understands ways to manage prolapse with lifting and daily activities by working with pressure management.  Baseline:  Goal status: INITIAL  3.  Pelvic floor strength is >/= 3/5 holding for 10 seconds with circular contraction and hug to reduce her leakage.  Baseline:  Goal status: INITIAL  4.  Patient reports her urinary leakage decreased >/= 75% and wears a pad for just in  case.  Baseline:  Goal status: INITIAL   PLAN: PT FREQUENCY: 1x/week  PT DURATION: 12 weeks  PLANNED INTERVENTIONS: Therapeutic exercises, Therapeutic activity, Neuromuscular re-education, Patient/Family education, Self Care, Biofeedback, and Manual therapy  PLAN FOR NEXT SESSION: work on the pelvic floor for circular contraction, pressure management, abdominal strength   Eulis Foster, PT 10/26/21 4:59 PM

## 2021-10-26 ENCOUNTER — Encounter: Payer: Medicare Other | Attending: Obstetrics and Gynecology | Admitting: Physical Therapy

## 2021-10-26 ENCOUNTER — Other Ambulatory Visit: Payer: Self-pay

## 2021-10-26 ENCOUNTER — Encounter: Payer: Self-pay | Admitting: Obstetrics and Gynecology

## 2021-10-26 ENCOUNTER — Encounter: Payer: Self-pay | Admitting: Physical Therapy

## 2021-10-26 DIAGNOSIS — N3281 Overactive bladder: Secondary | ICD-10-CM

## 2021-10-26 DIAGNOSIS — R278 Other lack of coordination: Secondary | ICD-10-CM | POA: Insufficient documentation

## 2021-10-26 DIAGNOSIS — M6281 Muscle weakness (generalized): Secondary | ICD-10-CM | POA: Diagnosis not present

## 2021-10-30 MED ORDER — TROSPIUM CHLORIDE ER 60 MG PO CP24: 1.0000 | ORAL_CAPSULE | Freq: Every day | ORAL | 5 refills | Status: DC

## 2021-11-28 ENCOUNTER — Encounter: Payer: Medicare Other | Admitting: Physical Therapy

## 2021-12-01 NOTE — Telephone Encounter (Signed)
Noted  

## 2021-12-12 ENCOUNTER — Encounter: Payer: Self-pay | Admitting: Physical Therapy

## 2021-12-12 ENCOUNTER — Encounter: Payer: Medicare Other | Attending: Obstetrics and Gynecology | Admitting: Physical Therapy

## 2021-12-12 DIAGNOSIS — R278 Other lack of coordination: Secondary | ICD-10-CM | POA: Diagnosis not present

## 2021-12-12 DIAGNOSIS — M6281 Muscle weakness (generalized): Secondary | ICD-10-CM

## 2021-12-12 DIAGNOSIS — N3941 Urge incontinence: Secondary | ICD-10-CM | POA: Diagnosis not present

## 2021-12-12 DIAGNOSIS — H524 Presbyopia: Secondary | ICD-10-CM | POA: Diagnosis not present

## 2021-12-12 NOTE — Patient Instructions (Signed)
About Pelvic Support Problems Pelvic Support Problems Explained Ligaments, muscles, and connective tissue normally hold your bladder, uterus, and other organs in their proper places in your pelvis. When these tissues become weak, a problem with pelvic support may result. Weak support can cause one or more of the pelvic organs to drop down into the vagina. An organ may even drop so far that is partially exposed outside the body.  Pelvic support problems are named by the change in the organ. The main types of pelvic support problems are:  Cystocele: When the bladder drops down into your vagina.  Enterocele: When your small intestine drops between your vagina and rectum.  Rectocele: When your rectum bulges into the vaginal wall.  Uterine prolapse: When your uterus drops into your vagina.  Vaginal prolapse: When the top part of the vagina begins to droop. This sometimes happens after a hysterectomy (removal of the uterus).  Causes Pelvic support problems can be caused by many conditions. They may begin after you give birth, especially if you had a large baby. During childbirth, the muscles and skin of the birth canal (vagina) are stretched and sometimes torn. They heal over time but are not always exactly the same. A long pushing stage of labor may also weaken these tissues as well as very rapid births as the tissues do not have time to stretch so they tear.  Also, after menopause, there are changes in the vaginal walls resulting from a decrease in estrogen. Estrogen helps to keep the tissues toned. Low levels of estrogen weaken the vaginal walls and may cause the bladder to shift from its normal position. As women get older, the loss of muscle tone and the relaxation of muscles may cause the uterus or other organs to drop.  Over time, conditions like chronic coughing, chronic constipation, doing a lot of heavy lifting, straining to pass stool, and obesity, can also weaken the pelvic support muscles.   Diagnosing Pelvic Support Problems Your health care provider will ask about your symptoms and do a pelvic examination. Your provider may also do a rectal exam during your pelvic exam. Your provider may ask you to: 1. Bear down and push (like you are having a bowel movement) so he or she can see if your bladder or other part of your body protrudes into the vagina. 2. Contract the muscles of your pelvis to check the strength of your pelvic muscles.  3. Do several types of urine, nerve and muscle tests of the pelvis and around the bladder to see what type of treatment is best for you.   Symptoms Symptoms of pelvic support problems depend on the organ involved, but may include:  urine leakage  stain or fecal loss after a bowel movement trouble having bowel movements  ache in the lower abdomen, groin, or lower back  bladder infection  a feeling of heaviness, pulling, or fullness in the pelvis, or a feeling that something is falling out of the vagina  an organ protruding from your vaginal opening  feeling the need to support the organs or perineal area to empty bladder or bowels painful sexual intercourse.  Many women feel pelvic pressure or trouble holding their urine immediately after childbirth. For some, these symptoms go away permanently, in others they return as they get older.  Treatment Options A prolapsed organ cannot repair itself. Contact your health care provider as soon as you notice symptoms of a problem. Treatment depends on what the specific problem is and how far  advanced it is.  The symptoms caused by some pelvic support problems may simply be treated with changes in diet, medicine to soften the stool, weight loss, or avoiding strenuous activities. You may also do pelvic floor exercises to help strengthen your pelvic muscles.  Some cases of prolapse may require a special support device made from plastic or rubber called a pessary that fits into the vagina to support the uterus,  vagina, or bladder. A pessary can also help women who leak urine when coughing, straining, or exercising. In mild cases, a tampon or vaginal diaphragm may be used instead of a pessary.  Talk to your doctor or health care provider about these options. In serious cases, surgery may be needed to put the organs back into their proper place. The uterus may be removed because of the pressure it puts on the bladder.  Your doctor will know what surgery will be best for you. How can I prevent pelvic support problems?  You can help prevent pelvic support problems by:  maintaining a healthy lifestyle  continuing to do pelvic floor exercises after you deliver a baby  maintaining a healthy weight  avoiding a lot of heavy lifting and lifting with your legs (not from your waist)  treating constipation and avoid getting  Earlie Counts, PT [facility] [place] [address] [place], [place] [address] W: [phone] [email or web address]

## 2021-12-12 NOTE — Therapy (Signed)
OUTPATIENT PHYSICAL THERAPY TREATMENT NOTE   Patient Name: Sally [name] MRN: [id number] DOB:14-Aug-1945, 76 y.o., female Today's Date: 12/12/2021  PCP: Marda Stalker, PA-C REFERRING PROVIDER: Jaquita Folds, MD  END OF SESSION:   PT End of Session - 12/12/21 1029     Visit Number 2    Date for PT Re-Evaluation 01/18/22    Authorization Type BCBS medicare    Authorization - Visit Number 2    Authorization - Number of Visits 10    PT Start Time 2863    PT Stop Time 1115    PT Time Calculation (min) 45 min    Activity Tolerance Patient tolerated treatment well    Behavior During Therapy [facility] for tasks assessed/performed             Past Medical History:  Diagnosis Date   Allergic rhinitis    Diabetes ([place])    type 2   High cholesterol    Hypertension    Right ureteral stone    Past Surgical History:  Procedure Laterality Date   CATARACT EXTRACTION Bilateral 2016-2017   colonscopy  2020   polyps removed   CYSTOSCOPY/URETEROSCOPY/HOLMIUM LASER/STENT PLACEMENT Right 12/08/2018   Procedure: CYSTOSCOPY/URETEROSCOPY/HOLMIUM LASER/STENT PLACEMENT;  Surgeon: Lucas Mallow, MD;  Location: [place];  Service: Urology;  Laterality: Right;   TUBAL LIGATION     There are no problems to display for this patient. REFERRING DIAG: N39.41 (ICD-10-CM) - Urge incontinence   THERAPY DIAG:  No diagnosis found.   Rationale for Evaluation and Treatment Rehabilitation   ONSET DATE: 04/2021   SUBJECTIVE:                                                                                                                                                                                            SUBJECTIVE STATEMENT: I am having trouble keeping the pessary in. Standing feels the prolapse come out and sitting does not.    PAIN:  Are you having pain? No   PRECAUTIONS: None   WEIGHT BEARING RESTRICTIONS No   FALLS:  Has patient fallen in last 6  months? No   LIVING ENVIRONMENT: Lives with: lives with their spouse     OCCUPATION: retired    PLOF: Independent   PATIENT GOALS reduce urinary leakage.    PERTINENT HISTORY:  Diabetes; Hypertension   BOWEL MOVEMENT Pain with bowel movement: No Type of bowel movement:Type (Bristol Stool Scale) type 1 or 4, Frequency 1-2 days, Strain Yes, and Splinting no Fully empty rectum: No Leakage: Yes: sometimes a hard piece with come out randomly Fiber supplement: Yes: citrucel  URINATION Pain with urination: No Fully empty bladder: Yes: with pessary Stream: Strong Urgency: Yes: must go when she gets up to go Frequency: Day time voids 5+.  Nocturia: 1-3 times per night to void. Leakage: Walking to the bathroom, Coughing, and Sneezing Pads: Yes: 1 liner per day   INTERCOURSE Pain with intercourse:  no pain or issues   PREGNANCY Vaginal deliveries 2   PROLAPSE Anterior wall stage III pelvic organ prolapse; #4 cube pessary; rectocele stage II; Stage 1 Apical       OBJECTIVE:    DIAGNOSTIC FINDINGS:  Pelvic floor strength I/V; Pelvic floor musculature:  PVR of 304 ml was obtained by bladder scan.Straight catheterization performed to confirm, PVR with catheterization is 567m     COGNITION:            Overall cognitive status: Within functional limits for tasks assessed                          SENSATION:            Light touch: Appears intact            Proprioception: Appears intact                  POSTURE: No Significant postural limitations               PELVIC ALIGNMENT:   LUMBARAROM/PROM   A/PROM A/PROM  eval  Flexion Decreased by 25%  Extension Decreased by 25%  Right lateral flexion Decreased by 25%  Left lateral flexion Decreased by 25%  Right rotation Decreased by 25%  Left rotation Decreased by 25%   (Blank rows = not tested)   LOWER EXTREMITY ROM:   Passive ROM Right eval Left eval  Hip internal rotation 25 25  Hip external rotation 50 60    (Blank rows = not tested)   LOWER EXTREMITY MMT:   MMT Right eval Left eval  Hip flexion   4/5  Hip abduction 4-/5 4/5     PALPATION:                 External Perineal Exam redness long the labias                             Internal Pelvic Floor tightness along the introitus   Patient confirms identification and approves PT to assess internal pelvic floor and treatment Yes   PELVIC MMT:   MMT eval  Vaginal 1/5 but after manual work to the sides of the introitus was 3/5 holding 5 sec 3 times  (Blank rows = not tested)         TONE: Increased around the introitus   PROLAPSE: Patient has the cube inserted   TODAY'S TREATMENT  12/12/2021 Neuromuscular re-education: Pelvic floor contraction training:supine with legs elevated squeeze ball and contract the pelvic floor   Supine with legs elevated contract the pelvic floor with knees out with red band as resistance   Supine with legs elevated pelvic floor contraction with small bridge   Supine with legs elevated hip flexion isometric and pelvic floor contraction.   Therapeutic activities: Functional strengthening activities:sit to stand keeping distance between rib cage and pubic bone    Supine to sit with breathing out    Squatting down to lift items to keep the distance between the rib cage and pubic bone     EVAL  Date: 10/26/2021 HEP established-see below      PATIENT EDUCATION:  12/12/2021 Education details: Access Code: [email or web address], education on pressure management with prolapse Person educated: Patient Education method: Explanation, Demonstration, Tactile cues, Verbal cues, and Handouts Education comprehension: verbalized understanding, returned demonstration, verbal cues required, tactile cues required, and needs further education     HOME EXERCISE PROGRAM: 12/12/2021 Access Code: [email or web address] URL: [email or web address] Date: 12/12/2021 Prepared by: Earlie Counts  Exercises - Hooklying Transversus  Abdominis Palpation  - 1 x daily - 7 x weekly - 1 sets - 10 reps - Seated Pelvic Floor Contraction  - 3-4 x daily - 7 x weekly - 1 sets - 5 reps - 5 sec hold - Seated Quick Flick Pelvic Floor Contractions  - 3-4 x daily - 7 x weekly - 1 sets - 5 reps - Diaphragmatic Breathing with Hips Elevated (for Pelvic Organ Prolapse)  - 1 x daily - 7 x weekly - 3 sets - 10 reps - Pelvic Floor Muscle Contraction and Adductor Squeeze With Hips Elevated (for Pelvic Organ Prolapse)  - 1 x daily - 7 x weekly - 1 sets - 10 reps - 5-10 sec hold - Pelvic Floor Muscle Contraction With Resisted Abduction With Hips Elevated (for Pelvic Organ Prolapse)  - 1 x daily - 7 x weekly - 1 sets - 10 reps - Pelvic Floor Muscle Contraction and Pelvic Tilt to Bridge With Hips Elevated (for Pelvic Organ Prolapse)  - 1 x daily - 7 x weekly - 1 sets - 10 reps - Supine Bilateral Isometric Hip Flexion  - 1 x daily - 7 x weekly - 1 sets - 10 reps   ASSESSMENT:   CLINICAL IMPRESSION: Patient is a 76 y.o. female who was seen today for physical therapy  treatment for urge incontinence.Patient reports her  pessary is falling out of the vaginal canal.  Patient has been out of town and not able to do her exercises. She is not in town and able to be consistent with them. Patient understands how to keep the distance between the rib cage and pubic bone. She does well with diaphragmatic breathing. Patient is able to feel the pelvic floor contraction.  Patient will benefit from skilled therapy to improve pelvic floor strength and coordination to reduce her leakage.    OBJECTIVE IMPAIRMENTS decreased coordination, decreased endurance, decreased strength, and increased fascial restrictions.    ACTIVITY LIMITATIONS continence   PARTICIPATION LIMITATIONS: community activity   PERSONAL FACTORS Fitness and 1-2 comorbidities:    Diabetes; Hypertension are also affecting patient's functional outcome.    REHAB POTENTIAL: Excellent   CLINICAL DECISION  MAKING: Stable/uncomplicated   EVALUATION COMPLEXITY: Low     GOALS: Goals reviewed with patient? Yes   SHORT TERM GOALS: Target date: 11/23/2021   Patient is able to contract the pelvic floor with a circular contraction.  Baseline: Goal status: INITIAL   2.  Patient understands the difference between incontinence pads and menstrual pads.  Baseline:  Goal status: Met 12/12/2021     LONG TERM GOALS: Target date: 01/18/2022    Patient is independent with her HEP for pelvic floor and core strength.  Baseline:  Goal status: INITIAL   2.  Patient understands ways to manage prolapse with lifting and daily activities by working with pressure management.  Baseline:  Goal status: INITIAL   3.  Pelvic floor strength is >/= 3/5 holding for 10 seconds with circular contraction and hug to reduce her leakage.  Baseline:  Goal status:  INITIAL   4.  Patient reports her urinary leakage decreased >/= 75% and wears a pad for just in case.  Baseline:  Goal status: INITIAL     PLAN: PT FREQUENCY: 1x/week   PT DURATION: 12 weeks   PLANNED INTERVENTIONS: Therapeutic exercises, Therapeutic activity, Neuromuscular re-education, Patient/Family education, Self Care, Biofeedback, and Manual therapy   PLAN FOR NEXT SESSION: See if she is using incontinence pads, review her HEP and see if she needs advanced one in supine, manual work to pelvic floor working on Electronics engineer, write renewal   Earlie Counts, PT 12/12/21 11:33 AM

## 2021-12-19 ENCOUNTER — Encounter: Payer: Medicare Other | Admitting: Physical Therapy

## 2021-12-26 ENCOUNTER — Encounter: Payer: Self-pay | Admitting: Obstetrics and Gynecology

## 2021-12-31 DIAGNOSIS — W57XXXA Bitten or stung by nonvenomous insect and other nonvenomous arthropods, initial encounter: Secondary | ICD-10-CM | POA: Diagnosis not present

## 2021-12-31 DIAGNOSIS — S51852A Open bite of left forearm, initial encounter: Secondary | ICD-10-CM | POA: Diagnosis not present

## 2022-01-09 DIAGNOSIS — Z23 Encounter for immunization: Secondary | ICD-10-CM | POA: Diagnosis not present

## 2022-01-11 ENCOUNTER — Encounter: Payer: Self-pay | Admitting: Physical Therapy

## 2022-01-15 ENCOUNTER — Ambulatory Visit: Payer: Medicare Other | Admitting: Obstetrics and Gynecology

## 2022-01-15 ENCOUNTER — Encounter: Payer: Self-pay | Admitting: Obstetrics and Gynecology

## 2022-01-15 VITALS — BP 183/84 | HR 81

## 2022-01-15 DIAGNOSIS — N8110 Cystocele, unspecified: Secondary | ICD-10-CM

## 2022-01-15 DIAGNOSIS — N813 Complete uterovaginal prolapse: Secondary | ICD-10-CM

## 2022-01-15 DIAGNOSIS — N812 Incomplete uterovaginal prolapse: Secondary | ICD-10-CM | POA: Diagnosis not present

## 2022-01-15 DIAGNOSIS — N816 Rectocele: Secondary | ICD-10-CM | POA: Diagnosis not present

## 2022-01-15 NOTE — Progress Notes (Signed)
[facility]   Subjective:     Chief Complaint:  Chief Complaint  Patient presents with   Follow-up   History of Present Illness: Sally [name] is a 76 y.o. female with stage III pelvic organ prolapse and incomplete bladder emptying. Was using a size #4 cube pessary. After about a week or so, the pessary would fall out. She had some difficulty getting it back in and has kept it out for a while because she was traveling and did not want to deal with it.   Was using tropsium for about 2 months. Did not work well for her leakage. Has also gone through pelvic PT.   Has not had a new A1c since July- was 8.7% at that time.   Past Medical History: Patient  has a past medical history of Allergic rhinitis, Diabetes (HCC), High cholesterol, Hypertension, and Right ureteral stone.   Past Surgical History: She  has a past surgical history that includes Cataract extraction (Bilateral, 2016-2017); colonscopy (2020); Cystoscopy/ureteroscopy/holmium laser/stent placement (Right, 12/08/2018); and Tubal ligation.   Medications: She has a current medication list which includes the following prescription(s): acetaminophen, bepotastine besilate, cetirizine, vitamin d3, cyanocobalamin, jardiance, estradiol, fluticasone, humalog kwikpen, lantus solostar, metformin, ondansetron, oxycodone-acetaminophen, rosuvastatin, trospium chloride, and UNABLE TO FIND.   Allergies: Patient is allergic to codeine, lipitor [atorvastatin], and zocor [simvastatin].   Social History: Patient  reports that she has never smoked. She has never used smokeless tobacco. She reports current alcohol use. She reports that she does not use drugs.      Objective:    Physical Exam: BP (!) 183/84   Pulse 81  Gen: No apparent distress, A&O x 3.  Detailed Urogynecologic Evaluation:  Pelvic Exam: Normal external female genitalia; Bartholin's and Skene's glands normal in appearance; urethral meatus normal in  appearance, no urethral masses or discharge.   Speculum exam revealed no lesions in the vagina. POP-Q was repeated. She was fit with a #5 cube pessary. It was comfortable, fit well, and stayed in placed with strong cough, valsalva and bending. The patient demonstrated proper placement and removal with a string tied to the pessary. Lot # [id number]     POP-Q  2                                            Aa   2                                           Ba  -5                                              C   4                                            Gh  3.5  Pb  9                                            tvl   0                                            Ap  0                                            Bp  -8                                              D      Assessment/Plan:    Assessment: Sally [name] is a 76 y.o. with stage III pelvic organ prolapse  Plan: - We discussed that with her elevated A1c, would wait for surgery until her A1c is below 8%. She is meeting with her endocrinologist end of December.  - We discussed options for surgery including: sacrocolpopexy with mesh, vaginal native tissue repair (A&P repair w/ apical suspension +/- hysterectomy). Handouts provided. - Fit with a #5 pessary today. Recommend using this regularly to help reduce incomplete bladder emptying.  - Continue Estrace 0.5g twice a week  Return 2 months for follow up (after she has seen endocrinologist)   Marguerita Beards, MD  Time spent: I spent 25 minutes dedicated to the care of this patient on the date of this encounter to include pre-visit review of records, face-to-face time with the patient  and post visit documentation and ordering medication/ testing. Additional time was spent on the pessary fitting.

## 2022-01-15 NOTE — Patient Instructions (Addendum)
We discussed two options for prolapse repair:  1) vaginal repair without mesh - Pros - safer, no mesh complications - Cons - not as strong as mesh repair, higher risk of recurrence  2) laparoscopic repair with mesh - Pros - stronger, better long-term success - Cons - risks of mesh implant (erosion into vagina or bladder, adhering to the rectum, pain) - these risks are lower than with a vaginal mesh but still exist

## 2022-01-30 ENCOUNTER — Encounter: Payer: Self-pay | Admitting: Physical Therapy

## 2022-01-30 ENCOUNTER — Telehealth: Payer: Self-pay | Admitting: Physical Therapy

## 2022-01-30 NOTE — Telephone Encounter (Signed)
Called patient about her appointment she missed today at 1400. She was waiting for a reminder call. Therapist let her know she can look at my chart for her appt. She reports she will be at her next appt. On 12/12. Eulis Foster, PT @11 /28/2023@ 2:24 PM

## 2022-02-13 ENCOUNTER — Encounter: Payer: Self-pay | Admitting: Physical Therapy

## 2022-02-13 ENCOUNTER — Encounter: Payer: Medicare Other | Attending: Obstetrics and Gynecology | Admitting: Physical Therapy

## 2022-02-13 DIAGNOSIS — R278 Other lack of coordination: Secondary | ICD-10-CM | POA: Diagnosis not present

## 2022-02-13 DIAGNOSIS — M6281 Muscle weakness (generalized): Secondary | ICD-10-CM | POA: Diagnosis not present

## 2022-02-13 NOTE — Therapy (Addendum)
 OUTPATIENT PHYSICAL THERAPY TREATMENT NOTE   Patient Name: Sally [name] MRN: [id number] DOB:1945-08-29, 76 y.o., female Today's Date: 02/13/2022  PCP: Katina Pfeiffer, PA-C  REFERRING PROVIDER: Marilynne Rosaline SAILOR, MD   END OF SESSION:   PT End of Session - 02/13/22 1402     Visit Number 3    Date for PT Re-Evaluation 04/12/22    Authorization Type [employer] medicare    Authorization - Visit Number 3    Authorization - Number of Visits 10    PT Start Time 1400    PT Stop Time 1445    PT Time Calculation (min) 45 min    Activity Tolerance Patient tolerated treatment well    Behavior During Therapy [facility] for tasks assessed/performed             Past Medical History:  Diagnosis Date   Allergic rhinitis    Diabetes (HCC)    type 2   High cholesterol    Hypertension    Right ureteral stone    Past Surgical History:  Procedure Laterality Date   CATARACT EXTRACTION Bilateral 2016-2017   colonscopy  2020   polyps removed   CYSTOSCOPY/URETEROSCOPY/HOLMIUM LASER/STENT PLACEMENT Right 12/08/2018   Procedure: CYSTOSCOPY/URETEROSCOPY/HOLMIUM LASER/STENT PLACEMENT;  Surgeon: Carolee Sherwood JONETTA DOUGLAS, MD;  Location: [facility] [place];  Service: Urology;  Laterality: Right;   TUBAL LIGATION     There are no problems to display for this patient.  REFERRING DIAG: N39.41 (ICD-10-CM) - Urge incontinence   THERAPY DIAG:  No diagnosis found.   Rationale for Evaluation and Treatment Rehabilitation   ONSET DATE: 04/2021   SUBJECTIVE:                                                                                                                                                                                            SUBJECTIVE STATEMENT: I am thinking to have the surgery in the future when I get my diabetes under control.     PAIN:  Are you having pain? No   PRECAUTIONS: None   WEIGHT BEARING RESTRICTIONS No   FALLS:  Has patient fallen in last 6 months?  No   LIVING ENVIRONMENT: Lives with: lives with their spouse     OCCUPATION: retired    PLOF: Independent   PATIENT GOALS reduce urinary leakage.    PERTINENT HISTORY:  Diabetes; Hypertension   BOWEL MOVEMENT Pain with bowel movement: No Type of bowel movement:Type (Bristol Stool Scale) type 1 or 4, Frequency 1-2 days, Strain Yes, and Splinting no Fully empty rectum: No Leakage: Yes: sometimes a hard piece with come out randomly Fiber supplement:  Yes: citrucel   URINATION Pain with urination: No Fully empty bladder: Yes: with pessary Stream: Strong Urgency: Yes: must go when she gets up to go, 85% better.  Frequency: Day time voids 5+.  Nocturia: 1-3 times per night to void. Leakage: Walking to the bathroom, Coughing, and Sneezing Pads: Yes: 2-3  liner per day to keep things fresh.    INTERCOURSE Pain with intercourse:  no pain or issues   PREGNANCY Vaginal deliveries 2   PROLAPSE Anterior wall stage III pelvic organ prolapse; #4 cube pessary; rectocele stage II; Stage 1 Apical       OBJECTIVE:    DIAGNOSTIC FINDINGS:  Pelvic floor strength I/V; Pelvic floor musculature:  PVR of 304 ml was obtained by bladder scan.Straight catheterization performed to confirm, PVR with catheterization is     COGNITION:            Overall cognitive status: Within functional limits for tasks assessed                          SENSATION:            Light touch: Appears intact            Proprioception: Appears intact                  POSTURE: No Significant postural limitations               PELVIC ALIGNMENT:   LUMBARAROM/PROM   A/PROM A/PROM  eval  Flexion Decreased by 25%  Extension Decreased by 25%  Right lateral flexion Decreased by 25%  Left lateral flexion Decreased by 25%  Right rotation Decreased by 25%  Left rotation Decreased by 25%   (Blank rows = not tested)   LOWER EXTREMITY ROM:   Passive ROM Right eval Left eval  Hip internal rotation 25  25  Hip external rotation 50 60   (Blank rows = not tested)   LOWER EXTREMITY MMT:   MMT Right eval Left eval Right and left  02/13/22  Hip flexion   4/5 4/5  Hip abduction 4-/5 4/5 4/5     PALPATION:                 External Perineal Exam redness long the labias                             Internal Pelvic Floor tightness along the introitus   Patient confirms identification and approves PT to assess internal pelvic floor and treatment Yes  02/13/2022 Patient did not want internal pelvic floor assessment.  PELVIC MMT:   MMT eval  Vaginal 1/5 but after manual work to the sides of the introitus was 3/5 holding 5 sec 3 times  (Blank rows = not tested)         TONE: Increased around the introitus   PROLAPSE: Patient has the cube inserted   TODAY'S TREATMENT  02/13/2022  - Hooklying Transversus Abdominis Palpation  -  1 sets - 10 reps - Seated Pelvic Floor Contraction  - 1 sets - 5 reps - 8-10 sec hold - Seated Quick Flick Pelvic Floor Contractions  - 1 sets - 5 reps - Diaphragmatic Breathing with Hips Elevated (for Pelvic Organ Prolapse)  1 sets - 10 reps - Pelvic Floor Muscle Contraction and Adductor Squeeze With Hips Elevated (for Pelvic Organ Prolapse)  - 1 sets -  10 reps - 5 sec hold - Pelvic Floor Muscle Contraction With Resisted Abduction With Hips Elevated (for Pelvic Organ Prolapse)  1 sets - 10 reps - Pelvic Floor Muscle Contraction and Pelvic Tilt to Bridge With Hips Elevated (for Pelvic Organ Prolapse)   1 sets - 10 reps - Supine Transversus Abdominis Bracing - Hands on Thighs  - 1 x daily - 7 x weekly - 3 sets - 10 reps   12/12/2021 Neuromuscular re-education: Pelvic floor contraction training:supine with legs elevated squeeze ball and contract the pelvic floor                         Supine with legs elevated contract the pelvic floor with knees out with red band as resistance                         Supine with legs elevated pelvic floor contraction with  small bridge                         Supine with legs elevated hip flexion isometric and pelvic floor contraction.    Therapeutic activities: Functional strengthening activities:sit to stand keeping distance between rib cage and pubic bone                                     Supine to sit with breathing out                                     Squatting down to lift items to keep the distance between the rib cage and pubic bone                                        PATIENT EDUCATION:  02/13/2022 Education details: Access Code: [email or web address] Person educated: Patient Education method: Explanation, Demonstration, Tactile cues, Verbal cues, and Handouts Education comprehension: verbalized understanding, returned demonstration, verbal cues required, tactile cues required, and needs further education     HOME EXERCISE PROGRAM: 02/13/2022 Access Code: [email or web address] URL: [email or web address] Date: 02/13/2022 Prepared by: Channing Pereyra  Exercises - Hooklying Transversus Abdominis Palpation  - 1 x daily - 7 x weekly - 1 sets - 10 reps - Seated Pelvic Floor Contraction  - 3-4 x daily - 7 x weekly - 1 sets - 5 reps - 8-10 sec hold - Seated Quick Flick Pelvic Floor Contractions  - 3-4 x daily - 7 x weekly - 1 sets - 5 reps - Diaphragmatic Breathing with Hips Elevated (for Pelvic Organ Prolapse)  - 1 x daily - 3 x weekly - 1 sets - 10 reps - Pelvic Floor Muscle Contraction and Adductor Squeeze With Hips Elevated (for Pelvic Organ Prolapse)  - 1 x daily - 3 x weekly - 1 sets - 10 reps - 5 sec hold - Pelvic Floor Muscle Contraction With Resisted Abduction With Hips Elevated (for Pelvic Organ Prolapse)  - 1 x daily - 3 x weekly - 1 sets - 10 reps - Pelvic Floor Muscle Contraction and Pelvic Tilt to Bridge With Hips Elevated (for Pelvic Organ Prolapse)  - 1 x daily - 3 x weekly - 1 sets - 10  reps - Supine Transversus Abdominis Bracing - Hands on Thighs  - 1 x daily - 7 x weekly - 3 sets - 10  reps  ASSESSMENT:   CLINICAL IMPRESSION: Patient is a 76 y.o. female who was seen today for physical therapy  treatment for urge incontinence. Urgency is 85% better. She knows to use the technique to avoid the urge.   Urinary leakage is 50% better. Panty liners are dryer.  Patient understands how to manage her prolapse. She Patient will benefit from skilled therapy to improve pelvic floor strength and coordination to reduce her leakage.    OBJECTIVE IMPAIRMENTS decreased coordination, decreased endurance, decreased strength, and increased fascial restrictions.    ACTIVITY LIMITATIONS continence   PARTICIPATION LIMITATIONS: community activity   PERSONAL FACTORS Fitness and 1-2 comorbidities:    Diabetes; Hypertension are also affecting patient's functional outcome.    REHAB POTENTIAL: Excellent   CLINICAL DECISION MAKING: Stable/uncomplicated   EVALUATION COMPLEXITY: Low     GOALS: Goals reviewed with patient? Yes   SHORT TERM GOALS: Target date: 11/23/2021   Patient is able to contract the pelvic floor with a circular contraction.  Baseline: Goal status: ongoing 02/13/22   2.  Patient understands the difference between incontinence pads and menstrual pads.  Baseline:  Goal status: Met 12/12/2021     LONG TERM GOALS: Target date: 01/18/2022    Patient is independent with her HEP for pelvic floor and core strength.  Baseline:  Goal status: ongoing 02/13/22   2.  Patient understands ways to manage prolapse with lifting and daily activities by working with pressure management.  Baseline:  Goal status: Met 02/13/22   3.  Pelvic floor strength is >/= 3/5 holding for 10 seconds with circular contraction and hug to reduce her leakage.  Baseline:  Goal status: ongoing 02/13/22   4.  Patient reports her urinary leakage decreased >/= 75% and wears a pad for just in case.  Baseline:  Goal status: ongoing 02/13/22     PLAN: PT FREQUENCY: 1x/week   PT DURATION: 12 weeks    PLANNED INTERVENTIONS: Therapeutic exercises, Therapeutic activity, Neuromuscular re-education, Patient/Family education, Self Care, Biofeedback, and Manual therapy   PLAN FOR NEXT SESSION: review her HEP and see if she needs advanced one in supine,   Channing Pereyra, PT 02/13/22 2:05 PM   PHYSICAL THERAPY DISCHARGE SUMMARY  Visits from Start of Care: 2  Current functional level related to goals / functional outcomes: See above. Patient did not return after her last visit on 02/13/22.    Remaining deficits: See above.    Education / Equipment: HEP   Patient agrees to discharge. Patient goals were not met. Patient is being discharged due to not returning since the last visit. Thank you for the referral.   Channing Pereyra, PT 02/04/24 1:58 PM

## 2022-02-20 ENCOUNTER — Ambulatory Visit: Payer: Medicare Other | Admitting: Obstetrics and Gynecology

## 2022-03-07 DIAGNOSIS — E785 Hyperlipidemia, unspecified: Secondary | ICD-10-CM | POA: Diagnosis not present

## 2022-03-07 DIAGNOSIS — Z794 Long term (current) use of insulin: Secondary | ICD-10-CM | POA: Diagnosis not present

## 2022-03-07 DIAGNOSIS — I10 Essential (primary) hypertension: Secondary | ICD-10-CM | POA: Diagnosis not present

## 2022-03-07 DIAGNOSIS — E1165 Type 2 diabetes mellitus with hyperglycemia: Secondary | ICD-10-CM | POA: Diagnosis not present

## 2022-03-15 ENCOUNTER — Encounter: Payer: Self-pay | Admitting: Obstetrics and Gynecology

## 2022-03-20 ENCOUNTER — Ambulatory Visit: Payer: Medicare Other | Admitting: Obstetrics and Gynecology

## 2022-04-23 ENCOUNTER — Encounter: Payer: Self-pay | Admitting: *Deleted

## 2022-06-07 DIAGNOSIS — E1165 Type 2 diabetes mellitus with hyperglycemia: Secondary | ICD-10-CM | POA: Diagnosis not present

## 2022-06-07 DIAGNOSIS — Z794 Long term (current) use of insulin: Secondary | ICD-10-CM | POA: Diagnosis not present

## 2022-06-07 DIAGNOSIS — E785 Hyperlipidemia, unspecified: Secondary | ICD-10-CM | POA: Diagnosis not present

## 2022-06-07 DIAGNOSIS — I10 Essential (primary) hypertension: Secondary | ICD-10-CM | POA: Diagnosis not present

## 2022-07-03 DIAGNOSIS — K080 Exfoliation of teeth due to systemic causes: Secondary | ICD-10-CM | POA: Diagnosis not present

## 2022-07-04 DIAGNOSIS — L237 Allergic contact dermatitis due to plants, except food: Secondary | ICD-10-CM | POA: Diagnosis not present

## 2022-07-09 ENCOUNTER — Other Ambulatory Visit: Payer: Self-pay | Admitting: Family Medicine

## 2022-07-09 DIAGNOSIS — Z1231 Encounter for screening mammogram for malignant neoplasm of breast: Secondary | ICD-10-CM

## 2022-07-12 DIAGNOSIS — K080 Exfoliation of teeth due to systemic causes: Secondary | ICD-10-CM | POA: Diagnosis not present

## 2022-07-13 DIAGNOSIS — L237 Allergic contact dermatitis due to plants, except food: Secondary | ICD-10-CM | POA: Diagnosis not present

## 2022-07-13 DIAGNOSIS — S1086XA Insect bite of other specified part of neck, initial encounter: Secondary | ICD-10-CM | POA: Diagnosis not present

## 2022-07-19 ENCOUNTER — Ambulatory Visit: Payer: Medicare Other | Admitting: Obstetrics and Gynecology

## 2022-07-19 ENCOUNTER — Encounter: Payer: Self-pay | Admitting: Obstetrics and Gynecology

## 2022-07-19 VITALS — BP 116/78 | HR 70

## 2022-07-19 DIAGNOSIS — N8110 Cystocele, unspecified: Secondary | ICD-10-CM

## 2022-07-19 DIAGNOSIS — N812 Incomplete uterovaginal prolapse: Secondary | ICD-10-CM | POA: Diagnosis not present

## 2022-07-19 DIAGNOSIS — Z4689 Encounter for fitting and adjustment of other specified devices: Secondary | ICD-10-CM

## 2022-07-19 DIAGNOSIS — N816 Rectocele: Secondary | ICD-10-CM

## 2022-07-19 NOTE — Patient Instructions (Signed)
Use the estrogen cream higher into the vagina and can use it to insert pessary.

## 2022-07-19 NOTE — Progress Notes (Signed)
[facility]   Subjective:     Chief Complaint:  Chief Complaint  Patient presents with   Pessary Check    Sally [name] is a 77 y.o. female here for a pessary check.   History of Present Illness: Sally [name] is a 77 y.o. female with stage III pelvic organ prolapse who presents for a pessary check. She is using a size #4 cube pessary. The pessary has been working well and she has no complaints. She is using vaginal estrogen. She denies vaginal bleeding.  Past Medical History: Patient  has a past medical history of Allergic rhinitis, Diabetes (HCC), High cholesterol, Hypertension, and Right ureteral stone.   Past Surgical History: She  has a past surgical history that includes Cataract extraction (Bilateral, 2016-2017); colonscopy (2020); Cystoscopy/ureteroscopy/holmium laser/stent placement (Right, 12/08/2018); and Tubal ligation.   Medications: She has a current medication list which includes the following prescription(s): acetaminophen, bepotastine besilate, cetirizine, vitamin d3, cyanocobalamin, jardiance, estradiol, fluticasone, humalog kwikpen, lantus solostar, metformin, ondansetron, oxycodone-acetaminophen, rosuvastatin, trospium chloride, and UNABLE TO FIND.   Allergies: Patient is allergic to codeine, lipitor [atorvastatin], and zocor [simvastatin].   Social History: Patient  reports that she has never smoked. She has never used smokeless tobacco. She reports current alcohol use. She reports that she does not use drugs.      Objective:    Physical Exam: BP 116/78   Pulse 70  Gen: No apparent distress, A&O x 3. Detailed Urogynecologic Evaluation:  Pelvic Exam: Normal external female genitalia; Bartholin's and Skene's glands normal in appearance; urethral meatus with caruncle, no urethral masses or discharge. The pessary was noted to be in place. It was removed and cleaned. Speculum exam revealed erythema in the vagina. The pessary was  replaced. It was comfortable to the patient and fit well.     Assessment/Plan:    Assessment: Sally [name] is a 77 y.o. with stage III pelvic organ prolapse here for a pessary check. She is doing well.  Plan: She will keep the pessary in place until next visit. She will continue to use estrogen. She will follow-up for urodynamics as she is seeking to plan for surgery. Will plan for urodynamics testing and then f/u for surgical discussion with Dr. Florian [name].

## 2022-09-05 DIAGNOSIS — E1165 Type 2 diabetes mellitus with hyperglycemia: Secondary | ICD-10-CM | POA: Diagnosis not present

## 2022-09-12 DIAGNOSIS — I10 Essential (primary) hypertension: Secondary | ICD-10-CM | POA: Diagnosis not present

## 2022-09-12 DIAGNOSIS — E785 Hyperlipidemia, unspecified: Secondary | ICD-10-CM | POA: Diagnosis not present

## 2022-09-12 DIAGNOSIS — Z794 Long term (current) use of insulin: Secondary | ICD-10-CM | POA: Diagnosis not present

## 2022-09-12 DIAGNOSIS — E119 Type 2 diabetes mellitus without complications: Secondary | ICD-10-CM | POA: Diagnosis not present

## 2022-09-18 ENCOUNTER — Encounter: Payer: Self-pay | Admitting: Obstetrics and Gynecology

## 2022-09-18 ENCOUNTER — Ambulatory Visit (INDEPENDENT_AMBULATORY_CARE_PROVIDER_SITE_OTHER): Payer: Medicare Other | Admitting: Obstetrics and Gynecology

## 2022-09-18 VITALS — BP 157/82 | HR 79

## 2022-09-18 DIAGNOSIS — N3281 Overactive bladder: Secondary | ICD-10-CM

## 2022-09-18 DIAGNOSIS — R339 Retention of urine, unspecified: Secondary | ICD-10-CM | POA: Diagnosis not present

## 2022-09-18 DIAGNOSIS — R350 Frequency of micturition: Secondary | ICD-10-CM

## 2022-09-18 LAB — POCT URINALYSIS DIPSTICK
Bilirubin, UA: NEGATIVE
Blood, UA: NEGATIVE
Glucose, UA: POSITIVE — AB
Ketones, UA: NEGATIVE
Leukocytes, UA: NEGATIVE
Nitrite, UA: NEGATIVE
Protein, UA: NEGATIVE
Spec Grav, UA: >=1.03 — AB (ref 1.010–1.025)
Urobilinogen, UA: 0.2 E.U./dL
pH, UA: 6 (ref 5.0–8.0)

## 2022-09-18 NOTE — Progress Notes (Signed)
[facility] Urodynamics Procedure  Referring Physician: Jarrett Soho, PA-C Date of Procedure: 09/18/2022  Sally [name] is a 77 y.o. female who presents for urodynamic evaluation. Indication(s) for study: occult SUI  Vital Signs: BP (!) 157/82   Pulse 79   Laboratory Results: A catheterized urine specimen revealed:  POC urine: Positive for glucose negative for all other components   Voiding Diary: Not done  Procedure Timeout:  The correct patient was verified and the correct procedure was verified. The patient was in the correct position and safety precautions were reviewed based on at the patient's history.  Urodynamic Procedure A 33F dual lumen urodynamics catheter was placed under sterile conditions into the patient's bladder. A 33F catheter was placed into the rectum in order to measure abdominal pressure. EMG patches were placed in the appropriate position.  All connections were confirmed and calibrations/adjusted made. Saline was instilled into the bladder through the dual lumen catheters.  Cough/valsalva pressures were measured periodically during filling.  Patient was allowed to void.  The bladder was then emptied of its residual.  UROFLOW: Uroflow was not captured by computer due to [facility] connectivity issues.  Initial void was 75ml and catheterized PVR was  CMG: This was performed with sterile water in the sitting position at a fill rate of 30 mL/min.    First sensation of fullness was 182 mLs,  First urge was 193 mLs,  Strong urge was 291 mLs and  Capacity was 608 mLs  Stress incontinence was not demonstrated Highest negative Barrier CLPP was 124 cmH20 at 470 ml. Highest negative Barrier VLPP was 84 cmH20 at 470 ml.  Detrusor function was normal, with no phasic contractions seen.   Compliance:  Low. End fill detrusor pressure was 46cmH20.  Calculated compliance was 13.2 [id number]  UPP: MUCP with barrier reduction was 30 cm of  water.    MICTURITION STUDY: Voiding was performed with reduction using scopettes in the sitting position.  Pdet at Qmax was 17.2 cm of water.  Qmax was 19.1 mL/sec.  It was a normal pattern.  She voided 509 mL and had a residual of 100 mL.  It was a volitional void, sustained detrusor contraction was present and abdominal straining was not present  EMG: This was performed with patches.  She had voluntary contractions, recruitment with fill was present and urethral sphincter was not relaxed with void.  The details of the procedure with the study tracings have been scanned into EPIC.   Urodynamic Impression:  1. Sensation was reduced; capacity was normal 2. Stress Incontinence was not demonstrated at normal pressures; 3. Detrusor Overactivity was not demonstrated. 4. Emptying was dysfunctional with a normal PVR, a sustained detrusor contraction present,  abdominal straining not present, dyssynergic urethral sphincter activity on EMG.  Plan: - The patient will follow up  to discuss the findings and treatment options.

## 2022-09-19 ENCOUNTER — Ambulatory Visit: Payer: Medicare Other | Admitting: Obstetrics and Gynecology

## 2022-09-20 DIAGNOSIS — R220 Localized swelling, mass and lump, head: Secondary | ICD-10-CM | POA: Diagnosis not present

## 2022-09-20 DIAGNOSIS — I779 Disorder of arteries and arterioles, unspecified: Secondary | ICD-10-CM | POA: Diagnosis not present

## 2022-10-01 ENCOUNTER — Ambulatory Visit: Admission: RE | Admit: 2022-10-01 | Payer: Medicare Other | Source: Ambulatory Visit

## 2022-10-01 DIAGNOSIS — Z1231 Encounter for screening mammogram for malignant neoplasm of breast: Secondary | ICD-10-CM | POA: Diagnosis not present

## 2022-10-03 ENCOUNTER — Encounter: Payer: Self-pay | Admitting: Obstetrics and Gynecology

## 2022-10-03 ENCOUNTER — Ambulatory Visit: Payer: Medicare Other | Admitting: Obstetrics and Gynecology

## 2022-10-03 VITALS — BP 138/84 | HR 67

## 2022-10-03 DIAGNOSIS — N812 Incomplete uterovaginal prolapse: Secondary | ICD-10-CM | POA: Diagnosis not present

## 2022-10-03 DIAGNOSIS — N816 Rectocele: Secondary | ICD-10-CM

## 2022-10-03 DIAGNOSIS — N8110 Cystocele, unspecified: Secondary | ICD-10-CM

## 2022-10-03 NOTE — Progress Notes (Signed)
Sally [name] Return Visit  SUBJECTIVE  History of Present Illness: Sally [name] is a 77 y.o. female seen in follow-up for prolapse. She underwent urodynamic testing.   Currently using a #4 cube pessary. She cleans this herself. Interested in surgery- previously delayed due to elevated A1c.  New A1c on 09/05/22 was 7.3%. Has been doing well with the trospium for OAB symptoms.  Underwent urodynamics which showed:  Urodynamic Impression:  1. Sensation was reduced; capacity was normal 2. Stress Incontinence was not demonstrated at normal pressures; 3. Detrusor Overactivity was not demonstrated. 4. Emptying was dysfunctional with a normal PVR, a sustained detrusor contraction present,  abdominal straining not present, dyssynergic urethral sphincter activity on EMG. -PVR without pessary . PVR with prolapse reduced was .   Past Medical History: Patient  has a past medical history of Allergic rhinitis, Diabetes (HCC), High cholesterol, Hypertension, and Right ureteral stone.   Past Surgical History: She  has a past surgical history that includes Cataract extraction (Bilateral, 2016-2017); colonscopy (2020); Cystoscopy/ureteroscopy/holmium laser/stent placement (Right, 12/08/2018); and Tubal ligation.   Medications: She has a current medication list which includes the following prescription(s): acetaminophen, bepotastine besilate, cetirizine, vitamin d3, cyanocobalamin, jardiance, estradiol, fluticasone, humalog kwikpen, lantus solostar, metformin, ondansetron, rosuvastatin, trospium chloride, UNABLE TO FIND, and oxycodone-acetaminophen.   Allergies: Patient is allergic to codeine, lipitor [atorvastatin], and zocor [simvastatin].   Social History: Patient  reports that she has never smoked. She has never used smokeless tobacco. She reports current alcohol use. She reports that she does not use drugs.      OBJECTIVE     Physical Exam: Vitals:   10/03/22 1117  10/03/22 1156  BP: (!) 169/77 138/84  Pulse: 73 67   Gen: No apparent distress, A&O x 3.  Detailed Urogynecologic Evaluation:  Deferred. Prior exam showed:  POP-Q (10/21/22):    POP-Q   2                                            Aa   2                                           Ba   -4                                              C    4.5                                            Gh   3.5                                            Pb   9  tvl    1                                            Ap   1                                            Bp   -7                                              D        ASSESSMENT AND PLAN    Sally [name] is a 77 y.o. with:  1. Prolapse of anterior vaginal wall   2. Prolapse of posterior vaginal wall   3. Uterovaginal prolapse, incomplete    - We discussed that UDS did not show incontinence. She wears pads and notices that they are wet but she is unsure if this is vaginal discharge. We discussed possibility of interval procedure if she has incontinence following prolapse repair.   Plan for surgery: Exam under anesthesia, robotic assisted total laparoscopic hysterectomy, bilateral salpingo-oophorectomy, sacrocolpopexy, cystoscopy, possible perineorrhaphy   - We reviewed the patient's specific anatomic and functional findings, with the assistance of diagrams, and together finalized the above procedure. The planned surgical procedures were discussed along with the surgical risks outlined below, which were also provided on a detailed handout. Additional treatment options including expectant management, conservative management, medical management were discussed where appropriate.  We reviewed the benefits and risks of each treatment option.   General Surgical Risks: For all procedures, there are risks of bleeding, infection, damage to surrounding organs including but not limited to bowel, bladder,  blood vessels, ureters and nerves, and need for further surgery if an injury were to occur. These risks are all low with minimally invasive surgery.   There are risks of numbness and weakness at any body site or buttock/rectal pain.  It is possible that baseline pain can be worsened by surgery, either with or without mesh. If surgery is vaginal, there is also a low risk of possible conversion to laparoscopy or open abdominal incision where indicated. Very rare risks include blood transfusion, blood clot, heart attack, pneumonia, or death.   There is also a risk of short-term postoperative urinary retention with need to use a catheter. About half of patients need to go home from surgery with a catheter, which is then later removed in the office. The risk of long-term need for a catheter is very low. There is also a risk of worsening of overactive bladder.   Prolapse (with or without mesh): Risk factors for surgical failure  include things that put pressure on your pelvis and the surgical repair, including obesity, chronic cough, and heavy lifting or straining (including lifting children or adults, straining on the toilet, or lifting heavy objects such as furniture or anything weighing >25 lbs. Risks of recurrence is 20-30% with vaginal native tissue repair and a less than 10% with sacrocolpopexy with mesh.    Sacrocolpopexy: Mesh implants may provide more prolapse support, but do have some unique risks to consider. It is important to understand that mesh is permanent and cannot be  easily removed. Risks of abdominal sacrocolpopexy mesh include mesh exposure (~3-6%), painful intercourse (recent studies show lower rates after surgery compared to before, with ~5-8% risk of new onset), and very rare risks of bowel or bladder injury or infection (<1%). The risk of mesh exposure is more likely in a woman with risks for poor healing (prior radiation, poorly controlled diabetes, or immunocompromised). The risk of  new or worsened chronic pain after mesh implant is more common in women with baseline chronic pain and/or poorly controlled anxiety or depression. There is an FDA safety notification on vaginal mesh procedures for prolapse but NOT abdominal mesh procedures and therefore does not apply to your surgery. We have extensive experience and training with mesh placement and we have close postoperative follow up to identify any potential complications from mesh.    - For preop Visit:  She is required to have a visit within 30 days of her surgery.    - Medical clearance: not required, updated Hgb A1c scanned into epic  - Anticoagulant use: No - Medicaid Hysterectomy form: No - Accepts blood transfusion: Yes - Expected length of stay: outpatient  Request sent for surgery scheduling.   Marguerita Beards, MD

## 2022-10-08 ENCOUNTER — Encounter: Payer: Self-pay | Admitting: Obstetrics and Gynecology

## 2022-10-23 DIAGNOSIS — E785 Hyperlipidemia, unspecified: Secondary | ICD-10-CM | POA: Diagnosis not present

## 2022-10-23 DIAGNOSIS — I10 Essential (primary) hypertension: Secondary | ICD-10-CM | POA: Diagnosis not present

## 2022-10-26 DIAGNOSIS — H5203 Hypermetropia, bilateral: Secondary | ICD-10-CM | POA: Diagnosis not present

## 2022-10-30 DIAGNOSIS — Z0000 Encounter for general adult medical examination without abnormal findings: Secondary | ICD-10-CM | POA: Diagnosis not present

## 2022-10-30 DIAGNOSIS — E1165 Type 2 diabetes mellitus with hyperglycemia: Secondary | ICD-10-CM | POA: Diagnosis not present

## 2022-10-30 DIAGNOSIS — R220 Localized swelling, mass and lump, head: Secondary | ICD-10-CM | POA: Diagnosis not present

## 2022-10-30 DIAGNOSIS — I10 Essential (primary) hypertension: Secondary | ICD-10-CM | POA: Diagnosis not present

## 2022-11-01 ENCOUNTER — Other Ambulatory Visit: Payer: Self-pay | Admitting: Family Medicine

## 2022-11-01 DIAGNOSIS — R220 Localized swelling, mass and lump, head: Secondary | ICD-10-CM

## 2022-11-07 ENCOUNTER — Other Ambulatory Visit: Payer: Self-pay

## 2022-11-07 ENCOUNTER — Encounter (HOSPITAL_BASED_OUTPATIENT_CLINIC_OR_DEPARTMENT_OTHER): Payer: Self-pay | Admitting: Obstetrics and Gynecology

## 2022-11-07 DIAGNOSIS — Z01810 Encounter for preprocedural cardiovascular examination: Secondary | ICD-10-CM | POA: Diagnosis not present

## 2022-11-07 DIAGNOSIS — Z01812 Encounter for preprocedural laboratory examination: Secondary | ICD-10-CM | POA: Diagnosis not present

## 2022-11-07 DIAGNOSIS — Z01818 Encounter for other preprocedural examination: Secondary | ICD-10-CM | POA: Diagnosis not present

## 2022-11-07 DIAGNOSIS — R9431 Abnormal electrocardiogram [ECG] [EKG]: Secondary | ICD-10-CM | POA: Diagnosis not present

## 2022-11-07 NOTE — Progress Notes (Addendum)
SURGERY WAS R/S TO 11/27/22.   Your procedure is scheduled on Wednesday, 11/21/22.  Report to [facility] AT  6:30 AM.   Call this number if you have problems the morning of surgery  [phone].   OUR ADDRESS IS [address].  WE ARE LOCATED IN [facility].  PLEASE BRING YOUR INSURANCE CARD AND PHOTO ID DAY OF SURGERY.  ONLY 2 PEOPLE ARE ALLOWED IN  WAITING  ROOM                                      REMEMBER:  DO NOT EAT FOOD, CANDY GUM OR MINTS  AFTER MIDNIGHT THE NIGHT BEFORE YOUR SURGERY . YOU MAY HAVE CLEAR LIQUIDS FROM MIDNIGHT THE NIGHT BEFORE YOUR SURGERY UNTIL  5:30 AM. NO CLEAR LIQUIDS AFTER   5:30 AM DAY OF SURGERY.  YOU MAY  BRUSH YOUR TEETH MORNING OF SURGERY AND RINSE YOUR MOUTH OUT, NO CHEWING GUM CANDY OR MINTS.     CLEAR LIQUID DIET    Allowed      Water                                                                   Coffee and tea, regular and decaf  (NO cream or milk products of any type, may sweeten)                         Carbonated beverages, regular and diet                                    Sports drinks like Gatorade _____________________________________________________________________     TAKE ONLY THESE MEDICATIONS MORNING OF SURGERY: Zyrtec, Flonase  Do not take Synjardy or insulin the morning of surgery. Take 1/2 dose of Lantus insulin the night before surgery.                                        DO NOT WEAR JEWERLY/  METAL/  PIERCINGS (INCLUDING NO PLASTIC PIERCINGS) DO NOT WEAR LOTIONS, POWDERS, PERFUMES OR NAIL POLISH ON YOUR FINGERNAILS. TOENAIL POLISH IS OK TO WEAR. DO NOT SHAVE FOR 48 HOURS PRIOR TO DAY OF SURGERY.  CONTACTS, GLASSES, OR DENTURES MAY NOT BE WORN TO SURGERY.  REMEMBER: NO SMOKING, VAPING ,  DRUGS OR ALCOHOL FOR 24 HOURS BEFORE YOUR SURGERY.                                    [facility] IS NOT RESPONSIBLE  FOR ANY BELONGINGS.                                                                     Sally [name] Kitchen  Sally [name] - Preparing for Surgery Before surgery, you can play an important role.  Because skin is not sterile, your skin needs to be as free of germs as possible.  You can reduce the number of germs on your skin by washing with CHG (chlorahexidine gluconate) soap before surgery.  CHG is an antiseptic cleaner which kills germs and bonds with the skin to continue killing germs even after washing. Please DO NOT use if you have an allergy to CHG or antibacterial soaps.  If your skin becomes reddened/irritated stop using the CHG and inform your nurse when you arrive at Short Stay. Do not shave (including legs and underarms) for at least 48 hours prior to the first CHG shower.  You may shave your face/neck. Please follow these instructions carefully:  1.  Shower with CHG Soap the night before surgery and the  morning of Surgery.  2.  If you choose to wash your hair, wash your hair first as usual with your  normal  shampoo.  3.  After you shampoo, rinse your hair and body thoroughly to remove the  shampoo.                                        4.  Use CHG as you would any other liquid soap.  You can apply chg directly  to the skin and wash , chg soap provided, night before and morning of your surgery.  5.  Apply the CHG Soap to your body ONLY FROM THE NECK DOWN.   Do not use on face/ open                           Wound or open sores. Avoid contact with eyes, ears mouth and genitals (private parts).                       Wash face,  Genitals (private parts) with your normal soap.             6.  Wash thoroughly, paying special attention to the area where your surgery  will be performed.  7.  Thoroughly rinse your body with warm water from the neck down.  8.  DO NOT shower/wash with your normal soap after using and rinsing off  the CHG Soap.             9.  Pat yourself dry with a clean towel.            10.  Wear clean pajamas.            11.  Place clean sheets on  your bed the night of your first shower and do not  sleep with pets. Day of Surgery : Do not apply any lotions/ powders the morning of surgery.  Please wear clean clothes to the hospital/surgery center.  IF YOU HAVE ANY SKIN IRRITATION OR PROBLEMS WITH THE SURGICAL SOAP, PLEASE GET A BAR OF GOLD DIAL SOAP AND SHOWER THE NIGHT BEFORE YOUR SURGERY AND THE MORNING OF YOUR SURGERY. PLEASE LET THE NURSE KNOW MORNING OF YOUR SURGERY IF YOU HAD ANY PROBLEMS WITH THE SURGICAL SOAP.   YOUR SURGEON MAY HAVE REQUESTED EXTENDED RECOVERY TIME AFTER YOUR SURGERY. IT COULD BE A  JUST A FEW HOURS  UP TO AN OVERNIGHT STAY.  YOUR SURGEON SHOULD HAVE DISCUSSED  THIS WITH YOU PRIOR TO YOUR SURGERY. IN THE EVENT YOU NEED TO STAY OVERNIGHT PLEASE REFER TO THE FOLLOWING GUIDELINES. YOU MAY HAVE UP TO 4 VISITORS  MAY VISIT IN THE EXTENDED RECOVERY ROOM UNTIL 800 PM ONLY.  ONE  VISITOR AGE 77 AND OVER MAY SPEND THE NIGHT AND MUST BE IN EXTENDED RECOVERY ROOM NO LATER THAN 800 PM . YOUR DISCHARGE TIME AFTER YOU SPEND THE NIGHT IS 900 AM THE MORNING AFTER YOUR SURGERY. YOU MAY PACK A SMALL OVERNIGHT BAG WITH TOILETRIES FOR YOUR OVERNIGHT STAY IF YOU WISH.  REGARDLESS OF IF YOU STAY OVER NIGHT OR ARE DISCHARGED THE SAME DAY YOU WILL BE REQUIRED TO HAVE A RESPONSIBLE ADULT (18 YRS OLD OR OLDER) STAY WITH YOU FOR AT LEAST THE FIRST 24 HOURS  YOUR PRESCRIPTION MEDICATIONS WILL BE PROVIDED DURING YOUR HOSPITAL STAY.  ________________________________________________________________________                                                        QUESTIONS Sally [name] PRE OP NURSE PHONE [phone].

## 2022-11-07 NOTE — Progress Notes (Addendum)
Spoke w/ via phone for pre-op interview---Sally [name] needs dos----none per anesthesia,surgeon orders pending               [name] results------11/19/22 [name] appt for cbc, bmp, type & screen, ekg COVID test -----patient states asymptomatic no test needed Arrive at -------0630 on Wednesday, 11/21/22 NPO after MN NO Solid Food.  Clear liquids from MN until---0530 Med rec completed Medications to take morning of surgery -----Zyrtec, Flonase Diabetic medication -----Do not take Synjardy or insulin the morning of surgery. Take 1/2 dose of Lantus insulin the night before surgery. Patient instructed no nail polish to be worn day of surgery Patient instructed to bring photo id and insurance card day of surgery Patient aware to have Driver (ride ) / caregiver    for 24 hours after surgery - husband, Earvin Hansen and sister, Dewayne Hatch Patient Special Instructions -----Extended / overnight stay instructions given. Pre-Op special Instructions -----Requested orders from Dr. Florian Buff via Epic IB on 11/07/22. Patient verbalized understanding of instructions that were given at this phone interview. Patient denies shortness of breath, chest pain, fever at this phone interview. See progress note dated 11/07/22 by Sterling Big, RN. Case was reviewed with Dr. Hyacinth Meeker concerning patient's symptoms of runny nose, scratchy throat and cough.   Addendum: I called patient on 11/13/22. She stated that she is now symptom free. Her surgery is scheduled for 11/27/22. I let her know the date and new arrival time of 5:30 am. Patient stated no changes in history or medications. Patient will come in for pre-op [name] appt on 11/19/22.

## 2022-11-07 NOTE — Progress Notes (Addendum)
During pre-op phone call with patient, she told me that on 11/06/22 she started with symptoms including runny nose, scratchy throat and cough. She stated that she gets this way from time to time due to her seasonal allergies. I spoke with Dr. Hyacinth Meeker, MDA and reviewed case with him. Per Dr. Hyacinth Meeker, patient needs to go see primary care/urgent care. If she is diagnosed with allergies only it is okay to proceed with surgery on 11/21/22 as planned. If she is diagnosed with an upper respiratory infection or a viral illness, her surgery will need to be postponed until she is symptom free for 2 weeks. Patient was agreeable to be seen by primary care for a diagnosis. I will call her back on Friday, 11/09/22 to find out result.  I spoke with patient on 11/09/22. Patient went to see her PCP yesterday. She tested negative for Covid, flu and rsv. She was put on amoxicillin for sinusitis. I reviewed the above information with Dr. Arby Barrette, MDA. Patient's surgery needs to be rescheduled until she is symptom free for 2 weeks. I called Dr. Jari Favre office and left a message with this information and requested a call back.

## 2022-11-08 DIAGNOSIS — J029 Acute pharyngitis, unspecified: Secondary | ICD-10-CM | POA: Diagnosis not present

## 2022-11-08 DIAGNOSIS — R0981 Nasal congestion: Secondary | ICD-10-CM | POA: Diagnosis not present

## 2022-11-08 DIAGNOSIS — Z20822 Contact with and (suspected) exposure to covid-19: Secondary | ICD-10-CM | POA: Diagnosis not present

## 2022-11-13 ENCOUNTER — Encounter: Payer: Self-pay | Admitting: Obstetrics and Gynecology

## 2022-11-13 ENCOUNTER — Ambulatory Visit (INDEPENDENT_AMBULATORY_CARE_PROVIDER_SITE_OTHER): Payer: Medicare Other | Admitting: Obstetrics and Gynecology

## 2022-11-13 ENCOUNTER — Other Ambulatory Visit: Payer: Self-pay

## 2022-11-13 ENCOUNTER — Encounter (HOSPITAL_BASED_OUTPATIENT_CLINIC_OR_DEPARTMENT_OTHER): Payer: Self-pay | Admitting: Obstetrics and Gynecology

## 2022-11-13 ENCOUNTER — Other Ambulatory Visit: Payer: Medicare Other

## 2022-11-13 VITALS — BP 189/81 | HR 73 | Wt 183.0 lb

## 2022-11-13 DIAGNOSIS — Z01818 Encounter for other preprocedural examination: Secondary | ICD-10-CM

## 2022-11-13 NOTE — Progress Notes (Signed)
Your procedure is scheduled on Tuesday, 11/27/2022.  Report to [facility] AT  5:30 AM.   Call this number if you have problems the morning of surgery  [phone].   OUR ADDRESS IS [address].  WE ARE LOCATED IN [facility].  PLEASE BRING YOUR INSURANCE CARD AND PHOTO ID DAY OF SURGERY.  ONLY 2 PEOPLE ARE ALLOWED IN  WAITING  ROOM                                      REMEMBER:  DO NOT EAT FOOD, CANDY GUM OR MINTS  AFTER MIDNIGHT THE NIGHT BEFORE YOUR SURGERY . YOU MAY HAVE CLEAR LIQUIDS FROM MIDNIGHT THE NIGHT BEFORE YOUR SURGERY UNTIL  4:30 AM. NO CLEAR LIQUIDS AFTER   4:30 AM DAY OF SURGERY.  YOU MAY  BRUSH YOUR TEETH MORNING OF SURGERY AND RINSE YOUR MOUTH OUT, NO CHEWING GUM CANDY OR MINTS.     CLEAR LIQUID DIET    Allowed      Water                                                                   Coffee and tea, regular and decaf  (NO cream or milk products of any type, may sweeten)                         Carbonated beverages, regular and diet                                    Sports drinks like Gatorade _____________________________________________________________________     TAKE ONLY THESE MEDICATIONS MORNING OF SURGERY: Zyrtec, Flonase  Do NOT take Synjardy or insulin the morning of surgery. Take 1/2 dose of Lantus insulin  the night before surgery.                                        DO NOT WEAR JEWERLY/  METAL/  PIERCINGS (INCLUDING NO PLASTIC PIERCINGS) DO NOT WEAR LOTIONS, POWDERS, PERFUMES OR NAIL POLISH ON YOUR FINGERNAILS. TOENAIL POLISH IS OK TO WEAR. DO NOT SHAVE FOR 48 HOURS PRIOR TO DAY OF SURGERY.  CONTACTS, GLASSES, OR DENTURES MAY NOT BE WORN TO SURGERY.  REMEMBER: NO SMOKING, VAPING ,  DRUGS OR ALCOHOL FOR 24 HOURS BEFORE YOUR SURGERY.                                    [facility] IS NOT RESPONSIBLE  FOR ANY BELONGINGS.                                                                    [name] Kitchen  [facility] - Preparing for Surgery Before surgery, you can play an important role.  Because skin is not sterile, your skin needs to be as free of germs as possible.  You can reduce the number of germs on your skin by washing with CHG (chlorahexidine gluconate) soap before surgery.  CHG is an antiseptic cleaner which kills germs and bonds with the skin to continue killing germs even after washing. Please DO NOT use if you have an allergy to CHG or antibacterial soaps.  If your skin becomes reddened/irritated stop using the CHG and inform your nurse when you arrive at Short Stay. Do not shave (including legs and underarms) for at least 48 hours prior to the first CHG shower.  You may shave your face/neck. Please follow these instructions carefully:  1.  Shower with CHG Soap the night before surgery and the  morning of Surgery.  2.  If you choose to wash your hair, wash your hair first as usual with your  normal  shampoo.  3.  After you shampoo, rinse your hair and body thoroughly to remove the  shampoo.                                        4.  Use CHG as you would any other liquid soap.  You can apply chg directly  to the skin and wash , chg soap provided, night before and morning of your surgery.  5.  Apply the CHG Soap to your body ONLY FROM THE NECK DOWN.   Do not use on face/ open                           Wound or open sores. Avoid contact with eyes, ears mouth and genitals (private parts).                       Wash face,  Genitals (private parts) with your normal soap.             6.  Wash thoroughly, paying special attention to the area where your surgery  will be performed.  7.  Thoroughly rinse your body with warm water from the neck down.  8.  DO NOT shower/wash with your normal soap after using and rinsing off  the CHG Soap.             9.  Pat yourself dry with a clean towel.            10.  Wear clean pajamas.            11.  Place clean sheets on your bed the night of your  first shower and do not  sleep with pets. Day of Surgery : Do not apply any lotions/ powders the morning of surgery.  Please wear clean clothes to the hospital/surgery center.  IF YOU HAVE ANY SKIN IRRITATION OR PROBLEMS WITH THE SURGICAL SOAP, PLEASE GET A BAR OF GOLD DIAL SOAP AND SHOWER THE NIGHT BEFORE YOUR SURGERY AND THE MORNING OF YOUR SURGERY. PLEASE LET THE NURSE KNOW MORNING OF YOUR SURGERY IF YOU HAD ANY PROBLEMS WITH THE SURGICAL SOAP.   YOUR SURGEON MAY HAVE REQUESTED EXTENDED RECOVERY TIME AFTER YOUR SURGERY. IT COULD BE A  JUST A FEW HOURS  UP TO AN OVERNIGHT STAY.  YOUR SURGEON SHOULD HAVE DISCUSSED  THIS WITH YOU PRIOR TO YOUR SURGERY. IN THE EVENT YOU NEED TO STAY OVERNIGHT PLEASE REFER TO THE FOLLOWING GUIDELINES. YOU MAY HAVE UP TO 4 VISITORS  MAY VISIT IN THE EXTENDED RECOVERY ROOM UNTIL 800 PM ONLY.  ONE  VISITOR AGE 67 AND OVER MAY SPEND THE NIGHT AND MUST BE IN EXTENDED RECOVERY ROOM NO LATER THAN 800 PM . YOUR DISCHARGE TIME AFTER YOU SPEND THE NIGHT IS 900 AM THE MORNING AFTER YOUR SURGERY. YOU MAY PACK A SMALL OVERNIGHT BAG WITH TOILETRIES FOR YOUR OVERNIGHT STAY IF YOU WISH.  REGARDLESS OF IF YOU STAY OVER NIGHT OR ARE DISCHARGED THE SAME DAY YOU WILL BE REQUIRED TO HAVE A RESPONSIBLE ADULT (18 YRS OLD OR OLDER) STAY WITH YOU FOR AT LEAST THE FIRST 24 HOURS  YOUR PRESCRIPTION MEDICATIONS WILL BE PROVIDED DURING YOUR HOSPITAL STAY.  ________________________________________________________________________                                                        QUESTIONS Mechele Claude PRE OP NURSE PHONE [phone].

## 2022-11-13 NOTE — Progress Notes (Signed)
[facility] Pre-Operative Exam  Subjective Chief Complaint: Sally [name] presents for a preoperative encounter.   History of Present Illness: Sally [name] is a 77 y.o. female who presents for preoperative visit.  She is scheduled to undergo  Exam under anesthesia, robotic assisted total laparoscopic hysterectomy, bilateral salpingo-oophorectomy, sacrocolpopexy, cystoscopy, possible perineorrhaphy on 11/27/22.  Her symptoms include Pelvic organ prolapse, and she was was found to have Stage III anterior, Stage II posterior, Stage I apical prolapse.   Urodynamics showed: 1. Sensation was reduced; capacity was normal 2. Stress Incontinence was not demonstrated at normal pressures; 3. Detrusor Overactivity was not demonstrated. 4. Emptying was dysfunctional with a normal PVR, a sustained detrusor contraction present,  abdominal straining not present, dyssynergic urethral sphincter activity on EMG.  Past Medical History:  Diagnosis Date   Allergic rhinitis    Diabetes (HCC)    type 2, Follows w/ Dr. Sharl Ma, endocrinologist   High cholesterol    History of kidney stones    Hypertension    Follows w/ PCP at [facility].   Prolapse of female pelvic organs    Right ureteral stone 2020     Past Surgical History:  Procedure Laterality Date   CATARACT EXTRACTION Bilateral 2016-2017   colonscopy  2020   polyps removed   CYSTOSCOPY/URETEROSCOPY/HOLMIUM LASER/STENT PLACEMENT Right 12/08/2018   Procedure: CYSTOSCOPY/URETEROSCOPY/HOLMIUM LASER/STENT PLACEMENT;  Surgeon: Crista Elliot, MD;  Location: [facility];  Service: Urology;  Laterality: Right;   TUBAL LIGATION      is allergic to codeine, lipitor [atorvastatin], and zocor [simvastatin].   Family History  Problem Relation Age of Onset   Cancer Mother    Heart attack Father    Allergic rhinitis Neg Hx    Asthma Neg Hx    Breast cancer Neg Hx     Social History   Tobacco Use    Smoking status: Never   Smokeless tobacco: Never  Vaping Use   Vaping status: Never Used  Substance Use Topics   Alcohol use: Yes    Comment: rarely   Drug use: Never     Review of Systems was negative for a full 10 system review except as noted in the History of Present Illness.   Current Outpatient Medications:    cetirizine (ZYRTEC) 10 MG tablet, Take 10 mg by mouth daily., Disp: , Rfl:    Cholecalciferol (VITAMIN D3) 2000 units TABS, Take by mouth as needed., Disp: , Rfl:    Cyanocobalamin (VITAMIN B 12 PO), Take by mouth. 1000 mg 3 x week, Disp: , Rfl:    diphenhydramine-acetaminophen (TYLENOL PM) 25-500 MG TABS tablet, Take 1 tablet by mouth at bedtime as needed., Disp: , Rfl:    Empagliflozin-metFORMIN HCl (SYNJARDY) 12.07-998 MG TABS, Take by mouth., Disp: , Rfl:    estradiol (ESTRACE) 0.1 MG/GM vaginal cream, Place 0.5g nightly for two weeks then twice a week after, Disp: 30 g, Rfl: 11   fluticasone (FLONASE) 50 MCG/ACT nasal spray, Place 2 sprays into both nostrils daily., Disp: , Rfl:    HUMALOG KWIKPEN 100 UNIT/ML KiwkPen, INJ 1 UNIT [place] PER 10  GRAMS OF CARBS AT MEALS PLUS 1 EXTRA UNIT FOR EVERY 20 MG/DL ABOVE 161 MG/DL. MAX OF 40 UNITS PER DAY, Disp: , Rfl: 2   Ibuprofen-diphenhydrAMINE Cit (ADVIL PM PO), Take by mouth at bedtime as needed., Disp: , Rfl:    LANTUS SOLOSTAR 100 UNIT/ML Solostar Pen, 12 units in am and 12 units in pm,  Disp: , Rfl: 0   losartan (COZAAR) 25 MG tablet, Take 25 mg by mouth daily., Disp: , Rfl:    rosuvastatin (CRESTOR) 10 MG tablet, at bedtime. , Disp: , Rfl: 1   UNABLE TO FIND, zquill I cap at bedtime, Disp: , Rfl:    Bepotastine Besilate (BEPREVE) 1.5 % SOLN, Use one drop in each eye twice daily if needed (Patient not taking: Reported on 11/07/2022), Disp: 10 mL, Rfl: 5   Objective Vitals:   11/13/22 1010 11/13/22 1041  BP: (!) 180/64 (!) 189/81  Pulse:      Gen: NAD CV: S1 S2 RRR Lungs: Clear to auscultation bilaterally Abd: soft,  nontender   Previous Pelvic Exam showed: Normal external female genitalia; Bartholin's and Skene's glands normal in appearance; urethral meatus normal in appearance, no urethral masses or discharge. Speculum exam revealed no lesions in the vagina. Pessary in place.     POP-Q:    POP-Q   2                                            Aa   2                                           Ba   -4                                              C    4.5                                            Gh   3.5                                            Pb   9                                            tvl    1                                            Ap   1                                            Bp   -7                                              D         Assessment/  Plan  Assessment: The patient is a 77 y.o. year old scheduled to undergo Exam under anesthesia, robotic assisted total laparoscopic hysterectomy, bilateral salpingo-oophorectomy, sacrocolpopexy, cystoscopy, possible perineorrhaphy. Verbal consent was obtained for these procedures.  Plan: General Surgical Consent: The patient has previously been counseled on alternative treatments, and the decision by the patient and provider was to proceed with the procedure listed above.  For all procedures, there are risks of bleeding, infection, damage to surrounding organs including but not limited to bowel, bladder, blood vessels, ureters and nerves, and need for further surgery if an injury were to occur. These risks are all low with minimally invasive surgery.   There are risks of numbness and weakness at any body site or buttock/rectal pain.  It is possible that baseline pain can be worsened by surgery, either with or without mesh. If surgery is vaginal, there is also a low risk of possible conversion to laparoscopy or open abdominal incision where indicated. Very rare risks include blood transfusion, blood clot, heart attack,  pneumonia, or death.   There is also a risk of short-term postoperative urinary retention with need to use a catheter. About half of patients need to go home from surgery with a catheter, which is then later removed in the office. The risk of long-term need for a catheter is very low. There is also a risk of worsening of overactive bladder.    Prolapse (with or without mesh): Risk factors for surgical failure  include things that put pressure on your pelvis and the surgical repair, including obesity, chronic cough, and heavy lifting or straining (including lifting children or adults, straining on the toilet, or lifting heavy objects such as furniture or anything weighing >25 lbs. Risks of recurrence is 20-30% with vaginal native tissue repair and a less than 10% with sacrocolpopexy with mesh.    Sacrocolpopexy: Mesh implants may provide more prolapse support, but do have some unique risks to consider. It is important to understand that mesh is permanent and cannot be easily removed. Risks of abdominal sacrocolpopexy mesh include mesh exposure (~3-6%), painful intercourse (recent studies show lower rates after surgery compared to before, with ~5-8% risk of new onset), and very rare risks of bowel or bladder injury or infection (<1%). The risk of mesh exposure is more likely in a woman with risks for poor healing (prior radiation, poorly controlled diabetes, or immunocompromised). The risk of new or worsened chronic pain after mesh implant is more common in women with baseline chronic pain and/or poorly controlled anxiety or depression. There is an FDA safety notification on vaginal mesh procedures for prolapse but NOT abdominal mesh procedures and therefore does not apply to your surgery. We have extensive experience and training with mesh placement and we have close postoperative follow up to identify any potential complications from mesh.    We discussed consent for blood products. Risks for blood  transfusion include allergic reactions, other reactions that can affect different body organs and managed accordingly, transmission of infectious diseases such as HIV or Hepatitis. However, the blood is screened. Patient consents for blood products.  Pre-operative instructions:  She was instructed to not take Aspirin/NSAIDs x 7days prior to surgery. Antibiotic prophylaxis was ordered as indicated.  Catheter use: Patient will go home with foley if needed after post-operative voiding trial.  Post-operative instructions:  She was provided with specific post-operative instructions, including precautions and signs/symptoms for which we would recommend contacting us, in addition to daytime and after-hours contact phone numbers. This was  provided on a handout.   Post-operative medications: Prescriptions for motrin, tylenol, miralax, and oxycodone were sent to her pharmacy. Discussed using ibuprofen and tylenol on a schedule to limit use of narcotics.   Laboratory testing:  We will check labs: CBC and Type and Screen ordered.   Preoperative clearance:  She does not require surgical clearance.    Post-operative follow-up:  A post-operative appointment will be made for 6 weeks from the date of surgery. If she needs a post-operative nurse visit for a voiding trial, that will be set up after she leaves the hospital.    Patient will call the clinic or use MyChart should anything change or any new issues arise.  Caprini Score: 6   Selmer Dominion, NP

## 2022-11-13 NOTE — H&P (Signed)
Urogynecology H&P  Subjective Chief Complaint: Sally [name] presents for a preoperative encounter.   History of Present Illness: Sally [name] is a 77 y.o. female who presents for preoperative visit.  She is scheduled to undergo  Exam under anesthesia, robotic assisted total laparoscopic hysterectomy, bilateral salpingo-oophorectomy, sacrocolpopexy, cystoscopy, possible perineorrhaphy on 11/27/22.  Her symptoms include Pelvic organ prolapse, and she was was found to have Stage III anterior, Stage II posterior, Stage I apical prolapse.   Urodynamics showed: 1. Sensation was reduced; capacity was normal 2. Stress Incontinence was not demonstrated at normal pressures; 3. Detrusor Overactivity was not demonstrated. 4. Emptying was dysfunctional with a normal PVR, a sustained detrusor contraction present,  abdominal straining not present, dyssynergic urethral sphincter activity on EMG.  Past Medical History:  Diagnosis Date   Allergic rhinitis    Diabetes (HCC)    type 2, Follows w/ Dr. Sharl Ma, endocrinologist   High cholesterol    History of kidney stones    Hypertension    Follows w/ PCP at [facility].   Prolapse of female pelvic organs    Right ureteral stone 2020     Past Surgical History:  Procedure Laterality Date   CATARACT EXTRACTION Bilateral 2016-2017   colonscopy  2020   polyps removed   CYSTOSCOPY/URETEROSCOPY/HOLMIUM LASER/STENT PLACEMENT Right 12/08/2018   Procedure: CYSTOSCOPY/URETEROSCOPY/HOLMIUM LASER/STENT PLACEMENT;  Surgeon: Crista Elliot, MD;  Location: [facility];  Service: Urology;  Laterality: Right;   TUBAL LIGATION      is allergic to codeine, lipitor [atorvastatin], and zocor [simvastatin].   Family History  Problem Relation Age of Onset   Cancer Mother    Heart attack Father    Allergic rhinitis Neg Hx    Asthma Neg Hx    Breast cancer Neg Hx     Social History   Tobacco Use   Smoking  status: Never   Smokeless tobacco: Never  Vaping Use   Vaping status: Never Used  Substance Use Topics   Alcohol use: Yes    Comment: rarely   Drug use: Never     Review of Systems was negative for a full 10 system review except as noted in the History of Present Illness.  No current facility-administered medications for this encounter.  Current Outpatient Medications:    diphenhydramine-acetaminophen (TYLENOL PM) 25-500 MG TABS tablet, Take 1 tablet by mouth at bedtime as needed., Disp: , Rfl:    Empagliflozin-metFORMIN HCl (SYNJARDY) 12.07-998 MG TABS, Take by mouth., Disp: , Rfl:    Ibuprofen-diphenhydrAMINE Cit (ADVIL PM PO), Take by mouth at bedtime as needed., Disp: , Rfl:    losartan (COZAAR) 25 MG tablet, Take 25 mg by mouth daily., Disp: , Rfl:    Bepotastine Besilate (BEPREVE) 1.5 % SOLN, Use one drop in each eye twice daily if needed (Patient not taking: Reported on 11/07/2022), Disp: 10 mL, Rfl: 5   cetirizine (ZYRTEC) 10 MG tablet, Take 10 mg by mouth daily., Disp: , Rfl:    Cholecalciferol (VITAMIN D3) 2000 units TABS, Take by mouth as needed., Disp: , Rfl:    Cyanocobalamin (VITAMIN B 12 PO), Take by mouth. 1000 mg 3 x week, Disp: , Rfl:    estradiol (ESTRACE) 0.1 MG/GM vaginal cream, Place 0.5g nightly for two weeks then twice a week after, Disp: 30 g, Rfl: 11   fluticasone (FLONASE) 50 MCG/ACT nasal spray, Place 2 sprays into both nostrils daily., Disp: , Rfl:    HUMALOG KWIKPEN 100  UNIT/ML KiwkPen, INJ 1 UNIT [place] PER 10  GRAMS OF CARBS AT MEALS PLUS 1 EXTRA UNIT FOR EVERY 20 MG/DL ABOVE 161 MG/DL. MAX OF 40 UNITS PER DAY, Disp: , Rfl: 2   LANTUS SOLOSTAR 100 UNIT/ML Solostar Pen, 12 units in am and 12 units in pm, Disp: , Rfl: 0   rosuvastatin (CRESTOR) 10 MG tablet, at bedtime. , Disp: , Rfl: 1   UNABLE TO FIND, zquill I cap at bedtime, Disp: , Rfl:    Objective There were no vitals filed for this visit.   Gen: NAD CV: S1 S2 RRR Lungs: Clear to auscultation  bilaterally Abd: soft, nontender   Previous Pelvic Exam showed: Normal external female genitalia; Bartholin's and Skene's glands normal in appearance; urethral meatus normal in appearance, no urethral masses or discharge. Speculum exam revealed no lesions in the vagina. Pessary in place.     POP-Q:    POP-Q   2                                            Aa   2                                           Ba   -4                                              C    4.5                                            Gh   3.5                                            Pb   9                                            tvl    1                                            Ap   1                                            Bp   -7                                              D         Assessment/ Plan  Assessment: The  patient is a 77 y.o. year old scheduled to undergo Exam under anesthesia, robotic assisted total laparoscopic hysterectomy, bilateral salpingo-oophorectomy, sacrocolpopexy, cystoscopy, possible perineorrhaphy. Verbal consent was obtained for these procedures.

## 2022-11-14 ENCOUNTER — Ambulatory Visit
Admission: RE | Admit: 2022-11-14 | Discharge: 2022-11-14 | Disposition: A | Discharge status: Home / Self Care | Payer: Medicare Other | Source: Ambulatory Visit | Attending: Family Medicine | Admitting: Family Medicine

## 2022-11-14 DIAGNOSIS — R220 Localized swelling, mass and lump, head: Secondary | ICD-10-CM | POA: Diagnosis not present

## 2022-11-19 ENCOUNTER — Encounter (HOSPITAL_COMMUNITY)
Admission: RE | Admit: 2022-11-19 | Discharge: 2022-11-19 | Disposition: A | Discharge status: Home / Self Care | Payer: Medicare Other | Source: Ambulatory Visit | Attending: Obstetrics and Gynecology

## 2022-11-19 DIAGNOSIS — R9431 Abnormal electrocardiogram [ECG] [EKG]: Secondary | ICD-10-CM | POA: Diagnosis not present

## 2022-11-19 DIAGNOSIS — Z01812 Encounter for preprocedural laboratory examination: Secondary | ICD-10-CM | POA: Diagnosis not present

## 2022-11-19 DIAGNOSIS — Z01810 Encounter for preprocedural cardiovascular examination: Secondary | ICD-10-CM | POA: Diagnosis not present

## 2022-11-19 DIAGNOSIS — Z01818 Encounter for other preprocedural examination: Secondary | ICD-10-CM

## 2022-11-19 LAB — CBC
HCT: 42.2 % (ref 36.0–46.0)
Hemoglobin: 13.3 g/dL (ref 12.0–15.0)
MCH: 30 pg (ref 26.0–34.0)
MCHC: 31.5 g/dL (ref 30.0–36.0)
MCV: 95.3 fL (ref 80.0–100.0)
Platelets: 313 10*3/uL (ref 150–400)
RBC: 4.43 MIL/uL (ref 3.87–5.11)
RDW: 13.7 % (ref 11.5–15.5)
WBC: 11.9 10*3/uL — ABNORMAL HIGH (ref 4.0–10.5)
nRBC: 0 % (ref 0.0–0.2)

## 2022-11-19 LAB — BASIC METABOLIC PANEL
Anion gap: 9 (ref 5–15)
BUN: 17 mg/dL (ref 8–23)
CO2: 22 mmol/L (ref 22–32)
Calcium: 9.2 mg/dL (ref 8.9–10.3)
Chloride: 109 mmol/L (ref 98–111)
Creatinine, Ser: 0.57 mg/dL (ref 0.44–1.00)
GFR, Estimated: >60 mL/min (ref >60)
Glucose, Bld: 236 mg/dL — ABNORMAL HIGH (ref 70–99)
Potassium: 4.1 mmol/L (ref 3.5–5.1)
Sodium: 140 mmol/L (ref 135–145)

## 2022-11-21 DIAGNOSIS — Z01818 Encounter for other preprocedural examination: Secondary | ICD-10-CM

## 2022-11-26 DIAGNOSIS — J069 Acute upper respiratory infection, unspecified: Secondary | ICD-10-CM | POA: Diagnosis not present

## 2022-11-26 DIAGNOSIS — Z6831 Body mass index (BMI) 31.0-31.9, adult: Secondary | ICD-10-CM | POA: Diagnosis not present

## 2022-11-27 ENCOUNTER — Ambulatory Visit (HOSPITAL_BASED_OUTPATIENT_CLINIC_OR_DEPARTMENT_OTHER)
Admission: RE | Admit: 2022-11-27 | Payer: Medicare Other | Source: Ambulatory Visit | Admitting: Obstetrics and Gynecology

## 2022-11-27 DIAGNOSIS — Z01818 Encounter for other preprocedural examination: Secondary | ICD-10-CM

## 2022-11-27 HISTORY — DX: Female genital prolapse, unspecified: N81.9

## 2022-11-27 HISTORY — DX: Personal history of urinary calculi: Z87.442

## 2022-11-27 LAB — TYPE AND SCREEN
ABO/RH(D): A POS
Antibody Screen: NEGATIVE

## 2022-11-27 SURGERY — HYSTERECTOMY, TOTAL, ROBOT-ASSISTED, WITH SACROCOLPOPEXY
Anesthesia: General

## 2022-12-26 DIAGNOSIS — R3915 Urgency of urination: Secondary | ICD-10-CM | POA: Diagnosis not present

## 2022-12-26 DIAGNOSIS — Z23 Encounter for immunization: Secondary | ICD-10-CM | POA: Diagnosis not present

## 2023-01-02 ENCOUNTER — Encounter: Payer: Medicare Other | Admitting: Obstetrics and Gynecology

## 2023-01-09 ENCOUNTER — Encounter: Payer: Medicare Other | Admitting: Obstetrics and Gynecology

## 2023-01-21 ENCOUNTER — Encounter: Payer: Self-pay | Admitting: Obstetrics and Gynecology

## 2023-01-21 ENCOUNTER — Ambulatory Visit (INDEPENDENT_AMBULATORY_CARE_PROVIDER_SITE_OTHER): Payer: Medicare Other | Admitting: Obstetrics and Gynecology

## 2023-01-21 VITALS — BP 148/88 | HR 80 | Wt 178.0 lb

## 2023-01-21 DIAGNOSIS — N390 Urinary tract infection, site not specified: Secondary | ICD-10-CM | POA: Diagnosis not present

## 2023-01-21 DIAGNOSIS — J3089 Other allergic rhinitis: Secondary | ICD-10-CM | POA: Diagnosis not present

## 2023-01-21 DIAGNOSIS — Z01818 Encounter for other preprocedural examination: Secondary | ICD-10-CM

## 2023-01-21 MED ORDER — POLYETHYLENE GLYCOL 3350 17 GM/SCOOP PO POWD: 17.0000 g | Freq: Every day | ORAL | 0 refills | Status: AC

## 2023-01-21 MED ORDER — ACETAMINOPHEN 500 MG PO TABS
500.0000 mg | ORAL_TABLET | Freq: Four times a day (QID) | ORAL | 0 refills | Status: AC | PRN
Start: 2023-01-21 — End: ?

## 2023-01-21 MED ORDER — IBUPROFEN 600 MG PO TABS: 600.0000 mg | ORAL_TABLET | Freq: Four times a day (QID) | ORAL | 0 refills | Status: AC | PRN

## 2023-01-21 MED ORDER — OXYCODONE HCL 5 MG PO TABS: 5.0000 mg | ORAL_TABLET | ORAL | 0 refills | Status: AC | PRN

## 2023-01-21 NOTE — Progress Notes (Signed)
[facility] Pre-Operative Exam  Subjective Chief Complaint: Cathren Vandoorn Peek presents for a preoperative encounter.   History of Present Illness: Lamani Leist is a 77 y.o. female who presents for preoperative visit.  She is scheduled to undergo  Exam under anesthesia, robotic assisted total laparoscopic hysterectomy, bilateral salpingo-oophorectomy, sacrocolpopexy, cystoscopy, possible perineorrhaphy on 02/11/23.  Her symptoms include Pelvic organ prolapse, and she was was found to have Stage III anterior, Stage II posterior, Stage I apical prolapse.   Urodynamics showed: 1. Sensation was reduced; capacity was normal 2. Stress Incontinence was not demonstrated at normal pressures; 3. Detrusor Overactivity was not demonstrated. 4. Emptying was dysfunctional with a normal PVR, a sustained detrusor contraction present,  abdominal straining not present, dyssynergic urethral sphincter activity on EMG.  Past Medical History:  Diagnosis Date   Allergic rhinitis    Diabetes (HCC)    type 2, Follows w/ Dr. Sharl Ma, endocrinologist   High cholesterol    History of kidney stones    Hypertension    Follows w/ PCP at [facility].   Prolapse of female pelvic organs    Right ureteral stone 2020     Past Surgical History:  Procedure Laterality Date   CATARACT EXTRACTION Bilateral 2016-2017   colonscopy  2020   polyps removed   CYSTOSCOPY/URETEROSCOPY/HOLMIUM LASER/STENT PLACEMENT Right 12/08/2018   Procedure: CYSTOSCOPY/URETEROSCOPY/HOLMIUM LASER/STENT PLACEMENT;  Surgeon: Crista Elliot, MD;  Location: [facility];  Service: Urology;  Laterality: Right;   TUBAL LIGATION      is allergic to codeine, lipitor [atorvastatin], and zocor [simvastatin].   Family History  Problem Relation Age of Onset   Cancer Mother    Heart attack Father    Allergic rhinitis Neg Hx    Asthma Neg Hx    Breast cancer Neg Hx     Social History   Tobacco Use    Smoking status: Never   Smokeless tobacco: Never  Vaping Use   Vaping status: Never Used  Substance Use Topics   Alcohol use: Yes    Comment: rarely   Drug use: Never     Review of Systems was negative for a full 10 system review except as noted in the History of Present Illness.   Current Outpatient Medications:    cetirizine (ZYRTEC) 10 MG tablet, Take 10 mg by mouth daily., Disp: , Rfl:    Cholecalciferol (VITAMIN D3) 2000 units TABS, Take by mouth as needed., Disp: , Rfl:    Cyanocobalamin (VITAMIN B 12 PO), Take by mouth. 1000 mg 3 x week, Disp: , Rfl:    diphenhydramine-acetaminophen (TYLENOL PM) 25-500 MG TABS tablet, Take 1 tablet by mouth at bedtime as needed., Disp: , Rfl:    Empagliflozin-metFORMIN HCl (SYNJARDY) 12.07-998 MG TABS, Take by mouth., Disp: , Rfl:    estradiol (ESTRACE) 0.1 MG/GM vaginal cream, Place 0.5g nightly for two weeks then twice a week after, Disp: 30 g, Rfl: 11   fluticasone (FLONASE) 50 MCG/ACT nasal spray, Place 2 sprays into both nostrils daily., Disp: , Rfl:    HUMALOG KWIKPEN 100 UNIT/ML KiwkPen, INJ 1 UNIT [place] PER 10  GRAMS OF CARBS AT MEALS PLUS 1 EXTRA UNIT FOR EVERY 20 MG/DL ABOVE 782 MG/DL. MAX OF 40 UNITS PER DAY, Disp: , Rfl: 2   Ibuprofen-diphenhydrAMINE Cit (ADVIL PM PO), Take by mouth at bedtime as needed., Disp: , Rfl:    LANTUS SOLOSTAR 100 UNIT/ML Solostar Pen, 12 units in am and 12 units in pm,  Disp: , Rfl: 0   losartan (COZAAR) 25 MG tablet, Take 25 mg by mouth daily., Disp: , Rfl:    rosuvastatin (CRESTOR) 10 MG tablet, at bedtime. , Disp: , Rfl: 1   UNABLE TO FIND, zquill I cap at bedtime, Disp: , Rfl:    Bepotastine Besilate (BEPREVE) 1.5 % SOLN, Use one drop in each eye twice daily if needed (Patient not taking: Reported on 11/07/2022), Disp: 10 mL, Rfl: 5   Objective Vitals:   01/21/23 1053  BP: (!) 148/88  Pulse: 80    Gen: NAD CV: S1 S2 RRR Lungs: Clear to auscultation bilaterally Abd: soft, nontender   Previous  Pelvic Exam showed: Normal external female genitalia; Bartholin's and Skene's glands normal in appearance; urethral meatus normal in appearance, no urethral masses or discharge. Speculum exam revealed no lesions in the vagina. Pessary in place.     POP-Q:    POP-Q   2                                            Aa   2                                           Ba   -4                                              C    4.5                                            Gh   3.5                                            Pb   9                                            tvl    1                                            Ap   1                                            Bp   -7                                              D         Assessment/ Plan  Assessment: The patient  is a 77 y.o. year old scheduled to undergo Exam under anesthesia, robotic assisted total laparoscopic hysterectomy, bilateral salpingo-oophorectomy, sacrocolpopexy, cystoscopy, possible perineorrhaphy. Verbal consent was obtained for these procedures.  Plan: General Surgical Consent: The patient has previously been counseled on alternative treatments, and the decision by the patient and provider was to proceed with the procedure listed above.  For all procedures, there are risks of bleeding, infection, damage to surrounding organs including but not limited to bowel, bladder, blood vessels, ureters and nerves, and need for further surgery if an injury were to occur. These risks are all low with minimally invasive surgery.   There are risks of numbness and weakness at any body site or buttock/rectal pain.  It is possible that baseline pain can be worsened by surgery, either with or without mesh. If surgery is vaginal, there is also a low risk of possible conversion to laparoscopy or open abdominal incision where indicated. Very rare risks include blood transfusion, blood clot, heart attack, pneumonia, or death.    There is also a risk of short-term postoperative urinary retention with need to use a catheter. About half of patients need to go home from surgery with a catheter, which is then later removed in the office. The risk of long-term need for a catheter is very low. There is also a risk of worsening of overactive bladder.    Prolapse (with or without mesh): Risk factors for surgical failure  include things that put pressure on your pelvis and the surgical repair, including obesity, chronic cough, and heavy lifting or straining (including lifting children or adults, straining on the toilet, or lifting heavy objects such as furniture or anything weighing >25 lbs. Risks of recurrence is 20-30% with vaginal native tissue repair and a less than 10% with sacrocolpopexy with mesh.    Sacrocolpopexy: Mesh implants may provide more prolapse support, but do have some unique risks to consider. It is important to understand that mesh is permanent and cannot be easily removed. Risks of abdominal sacrocolpopexy mesh include mesh exposure (~3-6%), painful intercourse (recent studies show lower rates after surgery compared to before, with ~5-8% risk of new onset), and very rare risks of bowel or bladder injury or infection (<1%). The risk of mesh exposure is more likely in a woman with risks for poor healing (prior radiation, poorly controlled diabetes, or immunocompromised). The risk of new or worsened chronic pain after mesh implant is more common in women with baseline chronic pain and/or poorly controlled anxiety or depression. There is an FDA safety notification on vaginal mesh procedures for prolapse but NOT abdominal mesh procedures and therefore does not apply to your surgery. We have extensive experience and training with mesh placement and we have close postoperative follow up to identify any potential complications from mesh.    We discussed consent for blood products. Risks for blood transfusion include  allergic reactions, other reactions that can affect different body organs and managed accordingly, transmission of infectious diseases such as HIV or Hepatitis. However, the blood is screened. Patient consents for blood products.  Pre-operative instructions:  She was instructed to not take Aspirin/NSAIDs x 7days prior to surgery. Antibiotic prophylaxis was ordered as indicated.  Catheter use: Patient will go home with foley if needed after post-operative voiding trial.  Post-operative instructions:  She was provided with specific post-operative instructions, including precautions and signs/symptoms for which we would recommend contacting us, in addition to daytime and after-hours contact phone numbers. This was provided on a handout.  Post-operative medications: Prescriptions for motrin, tylenol, miralax, and oxycodone were sent to her pharmacy. Discussed using ibuprofen and tylenol on a schedule to limit use of narcotics.   Laboratory testing:  We will check labs: CBC and Type and Screen ordered.   Preoperative clearance:  She does not require surgical clearance.    Post-operative follow-up:  A post-operative appointment will be made for 6 weeks from the date of surgery. If she needs a post-operative nurse visit for a voiding trial, that will be set up after she leaves the hospital.    Patient will call the clinic or use MyChart should anything change or any new issues arise.  Caprini Score: 6   Selmer Dominion, NP

## 2023-01-21 NOTE — H&P (Signed)
[facility] H&P  Subjective Chief Complaint: Sally [name] presents for a preoperative encounter.   History of Present Illness: Sally [name] is a 77 y.o. female who presents for preoperative visit.  She is scheduled to undergo  Exam under anesthesia, robotic assisted total laparoscopic hysterectomy, bilateral salpingo-oophorectomy, sacrocolpopexy, cystoscopy, possible perineorrhaphy on 02/11/23.  Her symptoms include Pelvic organ prolapse, and she was was found to have Stage III anterior, Stage II posterior, Stage I apical prolapse.   Urodynamics showed: 1. Sensation was reduced; capacity was normal 2. Stress Incontinence was not demonstrated at normal pressures; 3. Detrusor Overactivity was not demonstrated. 4. Emptying was dysfunctional with a normal PVR, a sustained detrusor contraction present,  abdominal straining not present, dyssynergic urethral sphincter activity on EMG.  Past Medical History:  Diagnosis Date   Allergic rhinitis    Diabetes (HCC)    type 2, Follows w/ Dr. Sharl Ma, endocrinologist   High cholesterol    History of kidney stones    Hypertension    Follows w/ PCP at [facility].   Prolapse of female pelvic organs    Right ureteral stone 2020     Past Surgical History:  Procedure Laterality Date   CATARACT EXTRACTION Bilateral 2016-2017   colonscopy  2020   polyps removed   CYSTOSCOPY/URETEROSCOPY/HOLMIUM LASER/STENT PLACEMENT Right 12/08/2018   Procedure: CYSTOSCOPY/URETEROSCOPY/HOLMIUM LASER/STENT PLACEMENT;  Surgeon: Crista Elliot, MD;  Location: [facility];  Service: Urology;  Laterality: Right;   TUBAL LIGATION      is allergic to codeine, lipitor [atorvastatin], and zocor [simvastatin].   Family History  Problem Relation Age of Onset   Cancer Mother    Heart attack Father    Allergic rhinitis Neg Hx    Asthma Neg Hx    Breast cancer Neg Hx     Social History   Tobacco Use   Smoking  status: Never   Smokeless tobacco: Never  Vaping Use   Vaping status: Never Used  Substance Use Topics   Alcohol use: Yes    Comment: rarely   Drug use: Never     Review of Systems was negative for a full 10 system review except as noted in the History of Present Illness.  No current facility-administered medications for this encounter.  Current Outpatient Medications:    acetaminophen (TYLENOL) 500 MG tablet, Take 1 tablet (500 mg total) by mouth every 6 (six) hours as needed (pain)., Disp: 30 tablet, Rfl: 0   Bepotastine Besilate (BEPREVE) 1.5 % SOLN, Use one drop in each eye twice daily if needed (Patient not taking: Reported on 11/07/2022), Disp: 10 mL, Rfl: 5   cetirizine (ZYRTEC) 10 MG tablet, Take 10 mg by mouth daily., Disp: , Rfl:    Cholecalciferol (VITAMIN D3) 2000 units TABS, Take by mouth as needed., Disp: , Rfl:    Cyanocobalamin (VITAMIN B 12 PO), Take by mouth. 1000 mg 3 x week, Disp: , Rfl:    diphenhydramine-acetaminophen (TYLENOL PM) 25-500 MG TABS tablet, Take 1 tablet by mouth at bedtime as needed., Disp: , Rfl:    Empagliflozin-metFORMIN HCl (SYNJARDY) 12.07-998 MG TABS, Take by mouth., Disp: , Rfl:    estradiol (ESTRACE) 0.1 MG/GM vaginal cream, Place 0.5g nightly for two weeks then twice a week after, Disp: 30 g, Rfl: 11   fluticasone (FLONASE) 50 MCG/ACT nasal spray, Place 2 sprays into both nostrils daily., Disp: , Rfl:    HUMALOG KWIKPEN 100 UNIT/ML KiwkPen, INJ 1 UNIT Welcome PER 10  GRAMS OF CARBS AT MEALS PLUS 1 EXTRA UNIT FOR EVERY 20 MG/DL ABOVE 818 MG/DL. MAX OF 40 UNITS PER DAY, Disp: , Rfl: 2   ibuprofen (ADVIL) 600 MG tablet, Take 1 tablet (600 mg total) by mouth every 6 (six) hours as needed., Disp: 30 tablet, Rfl: 0   Ibuprofen-diphenhydrAMINE Cit (ADVIL PM PO), Take by mouth at bedtime as needed., Disp: , Rfl:    LANTUS SOLOSTAR 100 UNIT/ML Solostar Pen, 12 units in am and 12 units in pm, Disp: , Rfl: 0   losartan (COZAAR) 25 MG tablet, Take 25 mg by mouth  daily., Disp: , Rfl:    oxyCODONE (OXY IR/ROXICODONE) 5 MG immediate release tablet, Take 1 tablet (5 mg total) by mouth every 4 (four) hours as needed for severe pain (pain score 7-10)., Disp: 15 tablet, Rfl: 0   polyethylene glycol powder (GLYCOLAX/MIRALAX) 17 GM/SCOOP powder, Take 17 g by mouth daily. Drink 17g (1 scoop) dissolved in water per day., Disp: 255 g, Rfl: 0   rosuvastatin (CRESTOR) 10 MG tablet, at bedtime. , Disp: , Rfl: 1   UNABLE TO FIND, zquill I cap at bedtime, Disp: , Rfl:    Objective There were no vitals filed for this visit.   Gen: NAD CV: S1 S2 RRR Lungs: Clear to auscultation bilaterally Abd: soft, nontender   Previous Pelvic Exam showed: Normal external female genitalia; Bartholin's and Skene's glands normal in appearance; urethral meatus normal in appearance, no urethral masses or discharge. Speculum exam revealed no lesions in the vagina. Pessary in place.     POP-Q:    POP-Q   2                                            Aa   2                                           Ba   -4                                              C    4.5                                            Gh   3.5                                            Pb   9                                            tvl    1  Ap   1                                            Bp   -7                                              D         Assessment/ Plan  Assessment: The patient is a 77 y.o. year old scheduled to undergo Exam under anesthesia, robotic assisted total laparoscopic hysterectomy, bilateral salpingo-oophorectomy, sacrocolpopexy, cystoscopy, possible perineorrhaphy. Verbal consent was obtained for these procedures.

## 2023-01-22 DIAGNOSIS — K080 Exfoliation of teeth due to systemic causes: Secondary | ICD-10-CM | POA: Diagnosis not present

## 2023-01-23 ENCOUNTER — Encounter: Payer: Self-pay | Admitting: Obstetrics and Gynecology

## 2023-01-28 ENCOUNTER — Other Ambulatory Visit: Payer: Self-pay | Admitting: Obstetrics and Gynecology

## 2023-01-28 MED ORDER — AMOXICILLIN-POT CLAVULANATE 875-125 MG PO TABS: 1.0000 | ORAL_TABLET | Freq: Two times a day (BID) | ORAL | 0 refills | Status: AC

## 2023-02-06 ENCOUNTER — Encounter (HOSPITAL_COMMUNITY)
Admission: RE | Admit: 2023-02-06 | Discharge: 2023-02-06 | Disposition: A | Discharge status: Home / Self Care | Payer: Medicare Other | Source: Ambulatory Visit | Attending: Obstetrics and Gynecology | Admitting: Obstetrics and Gynecology

## 2023-02-06 ENCOUNTER — Encounter (HOSPITAL_BASED_OUTPATIENT_CLINIC_OR_DEPARTMENT_OTHER): Payer: Self-pay | Admitting: Obstetrics and Gynecology

## 2023-02-06 ENCOUNTER — Other Ambulatory Visit: Payer: Self-pay

## 2023-02-06 DIAGNOSIS — Z01812 Encounter for preprocedural laboratory examination: Secondary | ICD-10-CM | POA: Diagnosis not present

## 2023-02-06 DIAGNOSIS — E119 Type 2 diabetes mellitus without complications: Secondary | ICD-10-CM | POA: Diagnosis not present

## 2023-02-06 DIAGNOSIS — Z01818 Encounter for other preprocedural examination: Secondary | ICD-10-CM

## 2023-02-06 LAB — HEMOGLOBIN A1C
Hgb A1c MFr Bld: 8 % — ABNORMAL HIGH (ref 4.8–5.6)
Mean Plasma Glucose: 182.9 mg/dL

## 2023-02-06 LAB — CBC
HCT: 42.7 % (ref 36.0–46.0)
Hemoglobin: 13.3 g/dL (ref 12.0–15.0)
MCH: 30.1 pg (ref 26.0–34.0)
MCHC: 31.1 g/dL (ref 30.0–36.0)
MCV: 96.6 fL (ref 80.0–100.0)
Platelets: 321 10*3/uL (ref 150–400)
RBC: 4.42 MIL/uL (ref 3.87–5.11)
RDW: 13 % (ref 11.5–15.5)
WBC: 6.8 10*3/uL (ref 4.0–10.5)
nRBC: 0 % (ref 0.0–0.2)

## 2023-02-06 LAB — BASIC METABOLIC PANEL
Anion gap: 6 (ref 5–15)
BUN: 18 mg/dL (ref 8–23)
CO2: 28 mmol/L (ref 22–32)
Calcium: 9.7 mg/dL (ref 8.9–10.3)
Chloride: 110 mmol/L (ref 98–111)
Creatinine, Ser: 0.64 mg/dL (ref 0.44–1.00)
GFR, Estimated: >60 mL/min (ref >60)
Glucose, Bld: 129 mg/dL — ABNORMAL HIGH (ref 70–99)
Potassium: 4.1 mmol/L (ref 3.5–5.1)
Sodium: 144 mmol/L (ref 135–145)

## 2023-02-06 NOTE — Progress Notes (Signed)
Patient was prescribed Augmentin to prevent a sinus infection on 01/28/23. Patient was having sinus drainage but did not want it to turn into a sinus infection with her upcoming surgery on 02/11/23. I reviewed this case with Dr. Malen Gauze, MDA. Per Dr. Malen Gauze, it is okay to proceed with surgery at [facility] on 02/11/23.

## 2023-02-06 NOTE — Progress Notes (Signed)
Your procedure is scheduled on Monday, 02/11/2023.  Report to [facility] AT  5:30 AM.   Call this number if you have problems the morning of surgery  [phone].   OUR ADDRESS IS [address].  WE ARE LOCATED IN [facility].  PLEASE BRING YOUR INSURANCE CARD AND PHOTO ID DAY OF SURGERY.                                     REMEMBER:  DO NOT EAT FOOD, CANDY GUM OR MINTS  AFTER MIDNIGHT THE NIGHT BEFORE YOUR SURGERY . YOU MAY HAVE CLEAR LIQUIDS FROM MIDNIGHT THE NIGHT BEFORE YOUR SURGERY UNTIL  4:30 AM. NO CLEAR LIQUIDS AFTER  4:30 AM DAY OF SURGERY.  YOU MAY  BRUSH YOUR TEETH MORNING OF SURGERY AND RINSE YOUR MOUTH OUT, NO CHEWING GUM CANDY OR MINTS.     CLEAR LIQUID DIET    Allowed      Water                                                                   Coffee and tea, regular and decaf  (NO cream or milk products of any type, may sweeten)                         Carbonated beverages, regular and diet                                    Sports drinks like Gatorade _____________________________________________________________________     TAKE ONLY THESE MEDICATIONS MORNING OF SURGERY: Zyrtec, Flonase  Do NOT take Synjardy or insulin on the morning of surgery. Take 1/2 dose of Lantus insulin the night before surgery.                                        DO NOT WEAR JEWERLY/  METAL/  PIERCINGS (INCLUDING NO PLASTIC PIERCINGS) DO NOT WEAR LOTIONS, POWDERS, PERFUMES OR NAIL POLISH ON YOUR FINGERNAILS. TOENAIL POLISH IS OK TO WEAR. DO NOT SHAVE FOR 48 HOURS PRIOR TO DAY OF SURGERY.  CONTACTS, GLASSES, OR DENTURES MAY NOT BE WORN TO SURGERY.  REMEMBER: NO SMOKING, VAPING ,  DRUGS OR ALCOHOL FOR 24 HOURS BEFORE YOUR SURGERY.                                    [employer] IS NOT RESPONSIBLE  FOR ANY BELONGINGS.                                                                    [name] Kitchen  [place] - Preparing for  Surgery Before surgery, you can play an important role.  Because skin is not sterile, your skin needs to be as free of germs as possible.  You can reduce the number of germs on your skin by washing with CHG (chlorahexidine gluconate) soap before surgery.  CHG is an antiseptic cleaner which kills germs and bonds with the skin to continue killing germs even after washing. Please DO NOT use if you have an allergy to CHG or antibacterial soaps.  If your skin becomes reddened/irritated stop using the CHG and inform your nurse when you arrive at Short Stay. Do not shave (including legs and underarms) for at least 48 hours prior to the first CHG shower.  You may shave your face/neck. Please follow these instructions carefully:  1.  Shower with CHG Soap the night before surgery and the  morning of Surgery.  2.  If you choose to wash your hair, wash your hair first as usual with your  normal  shampoo.  3.  After you shampoo, rinse your hair and body thoroughly to remove the  shampoo.                                        4.  Use CHG as you would any other liquid soap.  You can apply chg directly  to the skin and wash , chg soap provided, night before and morning of your surgery.  5.  Apply the CHG Soap to your body ONLY FROM THE NECK DOWN.   Do not use on face/ open                           Wound or open sores. Avoid contact with eyes, ears mouth and genitals (private parts).                       Wash face,  Genitals (private parts) with your normal soap.             6.  Wash thoroughly, paying special attention to the area where your surgery  will be performed.  7.  Thoroughly rinse your body with warm water from the neck down.  8.  DO NOT shower/wash with your normal soap after using and rinsing off  the CHG Soap.             9.  Pat yourself dry with a clean towel.            10.  Wear clean pajamas.            11.  Place clean sheets on your bed the night of your first shower and do not  sleep with  pets. Day of Surgery : Do not apply any lotions/ powders the morning of surgery.  Please wear clean clothes to the hospital/surgery center.  IF YOU HAVE ANY SKIN IRRITATION OR PROBLEMS WITH THE SURGICAL SOAP, PLEASE GET A BAR OF GOLD DIAL SOAP AND SHOWER THE NIGHT BEFORE YOUR SURGERY AND THE MORNING OF YOUR SURGERY. PLEASE LET THE NURSE KNOW MORNING OF YOUR SURGERY IF YOU HAD ANY PROBLEMS WITH THE SURGICAL SOAP.   YOUR SURGEON MAY HAVE REQUESTED EXTENDED RECOVERY TIME AFTER YOUR SURGERY. IT COULD BE A  JUST A FEW HOURS  UP TO AN OVERNIGHT STAY.  YOUR SURGEON SHOULD HAVE  DISCUSSED THIS WITH YOU PRIOR TO YOUR SURGERY. IN THE EVENT YOU NEED TO STAY OVERNIGHT PLEASE REFER TO THE FOLLOWING GUIDELINES. YOU MAY HAVE UP TO 4 VISITORS  MAY VISIT IN THE EXTENDED RECOVERY ROOM UNTIL 800 PM ONLY.  ONE  VISITOR AGE 67 AND OVER MAY SPEND THE NIGHT AND MUST BE IN EXTENDED RECOVERY ROOM NO LATER THAN 800 PM . YOUR DISCHARGE TIME AFTER YOU SPEND THE NIGHT IS 900 AM THE MORNING AFTER YOUR SURGERY. YOU MAY PACK A SMALL OVERNIGHT BAG WITH TOILETRIES FOR YOUR OVERNIGHT STAY IF YOU WISH.  REGARDLESS OF IF YOU STAY OVER NIGHT OR ARE DISCHARGED THE SAME DAY YOU WILL BE REQUIRED TO HAVE A RESPONSIBLE ADULT (18 YRS OLD OR OLDER) STAY WITH YOU FOR AT LEAST THE FIRST 24 HOURS  YOUR PRESCRIPTION MEDICATIONS WILL BE PROVIDED DURING YOUR HOSPITAL STAY.  ________________________________________________________________________                                                        QUESTIONS Sally [name] PRE OP NURSE PHONE [phone].

## 2023-02-06 NOTE — Progress Notes (Signed)
Spoke w/ via phone for pre-op [employer] needs dos----  none       Lab results------11/19/22 EKG in Epic & chart, 02/06/23 cbc, bmp, type & screen, HgA1c in Epic COVID test -----patient states asymptomatic no test needed Arrive at -------0530 on Monday, 02/11/23 NPO after MN NO Solid Food.  Clear liquids from MN until---0430 Med rec completed Medications to take morning of surgery -----Zyrtec, nasal spray  Diabetic medication -----Do not take Synjardy or insulin on the morning of surgery. Take 1/2 dose of Lantus insulin the night before surgery. Patient instructed no nail polish to be worn day of surgery Patient instructed to bring photo id and insurance card day of surgery Patient aware to have Driver (ride ) / caregiver    for 24 hours after surgery - husband, Earvin Hansen Patient Special Instructions -----Extended / overnight stay instructions given. Pre-Op special Instructions -----none Patient verbalized understanding of instructions that were given at this phone interview. Patient denies chest pain, sob, fever, cough at the interview.   Also see other progress note dated 02/06/23 by Sterling Big, RN.

## 2023-02-10 ENCOUNTER — Encounter (HOSPITAL_BASED_OUTPATIENT_CLINIC_OR_DEPARTMENT_OTHER): Payer: Self-pay | Admitting: Obstetrics and Gynecology

## 2023-02-10 NOTE — Anesthesia Preprocedure Evaluation (Signed)
Anesthesia Evaluation  Patient identified by MRN, date of birth, ID band Patient awake    Reviewed: Allergy & Precautions, NPO status , Patient's Chart, lab work & pertinent test results, reviewed documented beta blocker date and time   Airway Mallampati: III  TM Distance: >3 FB     Dental  (+) Teeth Intact, Caps, Dental Advisory Given   Pulmonary neg pulmonary ROS   Pulmonary exam normal breath sounds clear to auscultation       Cardiovascular hypertension, Pt. on medications Normal cardiovascular exam Rhythm:Regular Rate:Normal     Neuro/Psych negative neurological ROS  negative psych ROS   GI/Hepatic negative GI ROS, Neg liver ROS,,,  Endo/Other  diabetes, Poorly Controlled, Type 2, Oral Hypoglycemic Agents, Insulin Dependent  Obesity HLD  Renal/GU Renal diseaseHx/o renal calculi  negative genitourinary   Musculoskeletal negative musculoskeletal ROS (+)    Abdominal  (+) + obese  Peds  Hematology negative hematology ROS (+)   Anesthesia Other Findings   Reproductive/Obstetrics Uterovaginal prolapse-incomplete                              Anesthesia Physical Anesthesia Plan  ASA: 2  Anesthesia Plan: General   Post-op Pain Management: Dilaudid IV, Ofirmev IV (intra-op)* and Precedex   Induction: Intravenous  PONV Risk Score and Plan: 4 or greater and Treatment may vary due to age or medical condition and Ondansetron  Airway Management Planned: Oral ETT  Additional Equipment: None  Intra-op Plan:   Post-operative Plan: Extubation in OR  Informed Consent: I have reviewed the patients History and Physical, chart, labs and discussed the procedure including the risks, benefits and alternatives for the proposed anesthesia with the patient or authorized representative who has indicated his/her understanding and acceptance.     Dental advisory given  Plan Discussed with: CRNA  and Anesthesiologist  Anesthesia Plan Comments:         Anesthesia Quick Evaluation

## 2023-02-11 ENCOUNTER — Ambulatory Visit (HOSPITAL_BASED_OUTPATIENT_CLINIC_OR_DEPARTMENT_OTHER): Payer: Self-pay | Admitting: Anesthesiology

## 2023-02-11 ENCOUNTER — Ambulatory Visit (HOSPITAL_BASED_OUTPATIENT_CLINIC_OR_DEPARTMENT_OTHER)
Admission: RE | Admit: 2023-02-11 | Discharge: 2023-02-11 | Disposition: A | Discharge status: Home / Self Care | Payer: Medicare Other | Attending: Obstetrics and Gynecology | Admitting: Obstetrics and Gynecology

## 2023-02-11 ENCOUNTER — Other Ambulatory Visit: Payer: Self-pay

## 2023-02-11 ENCOUNTER — Encounter (HOSPITAL_BASED_OUTPATIENT_CLINIC_OR_DEPARTMENT_OTHER)
Admission: RE | Disposition: A | Discharge status: Home / Self Care | Payer: Self-pay | Source: Home / Self Care | Attending: Obstetrics and Gynecology

## 2023-02-11 ENCOUNTER — Encounter (HOSPITAL_BASED_OUTPATIENT_CLINIC_OR_DEPARTMENT_OTHER): Payer: Self-pay | Admitting: Obstetrics and Gynecology

## 2023-02-11 DIAGNOSIS — N812 Incomplete uterovaginal prolapse: Secondary | ICD-10-CM | POA: Diagnosis present

## 2023-02-11 DIAGNOSIS — E1165 Type 2 diabetes mellitus with hyperglycemia: Secondary | ICD-10-CM | POA: Insufficient documentation

## 2023-02-11 DIAGNOSIS — E669 Obesity, unspecified: Secondary | ICD-10-CM | POA: Insufficient documentation

## 2023-02-11 DIAGNOSIS — I10 Essential (primary) hypertension: Secondary | ICD-10-CM | POA: Diagnosis not present

## 2023-02-11 DIAGNOSIS — Z794 Long term (current) use of insulin: Secondary | ICD-10-CM | POA: Diagnosis not present

## 2023-02-11 DIAGNOSIS — N814 Uterovaginal prolapse, unspecified: Secondary | ICD-10-CM

## 2023-02-11 DIAGNOSIS — N8110 Cystocele, unspecified: Secondary | ICD-10-CM | POA: Diagnosis not present

## 2023-02-11 DIAGNOSIS — Z7984 Long term (current) use of oral hypoglycemic drugs: Secondary | ICD-10-CM | POA: Insufficient documentation

## 2023-02-11 DIAGNOSIS — D271 Benign neoplasm of left ovary: Secondary | ICD-10-CM | POA: Diagnosis not present

## 2023-02-11 DIAGNOSIS — N858 Other specified noninflammatory disorders of uterus: Secondary | ICD-10-CM | POA: Diagnosis not present

## 2023-02-11 DIAGNOSIS — Z01818 Encounter for other preprocedural examination: Secondary | ICD-10-CM

## 2023-02-11 DIAGNOSIS — D270 Benign neoplasm of right ovary: Secondary | ICD-10-CM | POA: Diagnosis not present

## 2023-02-11 HISTORY — PX: XI ROBOTIC ASSISTED TOTAL HYSTERECTOMY WITH SACROCOLPOPEXY: SHX6825

## 2023-02-11 HISTORY — PX: PERINEOPLASTY: SHX2218

## 2023-02-11 HISTORY — PX: CYSTOSCOPY: SHX5120

## 2023-02-11 LAB — TYPE AND SCREEN
ABO/RH(D): A POS
Antibody Screen: NEGATIVE

## 2023-02-11 LAB — GLUCOSE, CAPILLARY
Glucose-Capillary: 246 mg/dL — ABNORMAL HIGH (ref 70–99)
Glucose-Capillary: 192 mg/dL — ABNORMAL HIGH (ref 70–99)
Glucose-Capillary: 150 mg/dL — ABNORMAL HIGH (ref 70–99)

## 2023-02-11 SURGERY — HYSTERECTOMY, TOTAL, ROBOT-ASSISTED, WITH SACROCOLPOPEXY
Anesthesia: General | Site: Perineum

## 2023-02-11 MED ORDER — HYDROMORPHONE HCL 1 MG/ML IJ SOLN: 0.2500 mg | INTRAMUSCULAR | Status: DC | PRN

## 2023-02-11 MED ORDER — ROCURONIUM BROMIDE 10 MG/ML (PF) SYRINGE
PREFILLED_SYRINGE | INTRAVENOUS | Status: DC | PRN
  Administered 2023-02-11: 20 mg via INTRAVENOUS
  Administered 2023-02-11: 80 mg via INTRAVENOUS
  Administered 2023-02-11 (×2): 20 mg via INTRAVENOUS

## 2023-02-11 MED ORDER — DEXAMETHASONE SODIUM PHOSPHATE 10 MG/ML IJ SOLN
INTRAMUSCULAR | Status: AC
  Filled 2023-02-11: qty 1

## 2023-02-11 MED ORDER — PROPOFOL 10 MG/ML IV BOLUS
INTRAVENOUS | Status: DC | PRN
  Administered 2023-02-11: 170 mg via INTRAVENOUS

## 2023-02-11 MED ORDER — SUGAMMADEX SODIUM 200 MG/2ML IV SOLN
INTRAVENOUS | Status: DC | PRN
  Administered 2023-02-11: 200 mg via INTRAVENOUS

## 2023-02-11 MED ORDER — OXYCODONE HCL 5 MG PO TABS
5.0000 mg | ORAL_TABLET | ORAL | Status: DC | PRN
  Administered 2023-02-11: 5 mg via ORAL

## 2023-02-11 MED ORDER — LIDOCAINE 2% (20 MG/ML) 5 ML SYRINGE
INTRAMUSCULAR | Status: DC | PRN
  Administered 2023-02-11: 100 mg via INTRAVENOUS

## 2023-02-11 MED ORDER — INSULIN REGULAR HUMAN 100 UNIT/ML IJ SOLN
4.0000 [IU] | Freq: Once | INTRAMUSCULAR | Status: AC
  Administered 2023-02-11: 4 [IU] via SUBCUTANEOUS

## 2023-02-11 MED ORDER — POLYETHYLENE GLYCOL 3350 17 G PO PACK: 17.0000 g | PACK | Freq: Every day | ORAL | Status: DC | PRN

## 2023-02-11 MED ORDER — ARTIFICIAL TEARS OPHTHALMIC OINT
TOPICAL_OINTMENT | OPHTHALMIC | Status: AC
  Filled 2023-02-11: qty 3.5

## 2023-02-11 MED ORDER — CEFAZOLIN SODIUM-DEXTROSE 2-4 GM/100ML-% IV SOLN
2.0000 g | INTRAVENOUS | Status: AC
  Administered 2023-02-11: 2 g via INTRAVENOUS

## 2023-02-11 MED ORDER — SIMETHICONE 80 MG PO CHEW: 80.0000 mg | CHEWABLE_TABLET | Freq: Four times a day (QID) | ORAL | Status: DC | PRN

## 2023-02-11 MED ORDER — EPHEDRINE 5 MG/ML INJ
INTRAVENOUS | Status: AC
  Filled 2023-02-11: qty 5

## 2023-02-11 MED ORDER — LIDOCAINE HCL (PF) 2 % IJ SOLN
INTRAMUSCULAR | Status: AC
  Filled 2023-02-11: qty 5

## 2023-02-11 MED ORDER — ENOXAPARIN SODIUM 40 MG/0.4ML IJ SOSY
40.0000 mg | PREFILLED_SYRINGE | INTRAMUSCULAR | Status: AC
  Administered 2023-02-11: 40 mg via SUBCUTANEOUS

## 2023-02-11 MED ORDER — ONDANSETRON HCL 4 MG/2ML IJ SOLN
INTRAMUSCULAR | Status: DC | PRN
  Administered 2023-02-11: 4 mg via INTRAVENOUS

## 2023-02-11 MED ORDER — PHENAZOPYRIDINE HCL 100 MG PO TABS
200.0000 mg | ORAL_TABLET | ORAL | Status: AC
  Administered 2023-02-11: 200 mg via ORAL

## 2023-02-11 MED ORDER — FENTANYL CITRATE (PF) 100 MCG/2ML IJ SOLN
INTRAMUSCULAR | Status: AC
  Filled 2023-02-11: qty 2

## 2023-02-11 MED ORDER — IBUPROFEN 200 MG PO TABS: 600.0000 mg | ORAL_TABLET | Freq: Four times a day (QID) | ORAL | Status: DC

## 2023-02-11 MED ORDER — ONDANSETRON HCL 4 MG/2ML IJ SOLN: 4.0000 mg | Freq: Four times a day (QID) | INTRAMUSCULAR | Status: DC | PRN

## 2023-02-11 MED ORDER — ACETAMINOPHEN 325 MG PO TABS: 650.0000 mg | ORAL_TABLET | ORAL | Status: DC | PRN

## 2023-02-11 MED ORDER — EPHEDRINE SULFATE (PRESSORS) 50 MG/ML IJ SOLN
INTRAMUSCULAR | Status: DC | PRN
  Administered 2023-02-11: 10 mg via INTRAVENOUS

## 2023-02-11 MED ORDER — ACETAMINOPHEN 500 MG PO TABS
ORAL_TABLET | ORAL | Status: AC
  Filled 2023-02-11: qty 2

## 2023-02-11 MED ORDER — ENOXAPARIN SODIUM 40 MG/0.4ML IJ SOSY
PREFILLED_SYRINGE | INTRAMUSCULAR | Status: AC
  Filled 2023-02-11: qty 0.4

## 2023-02-11 MED ORDER — DEXMEDETOMIDINE HCL IN NACL 80 MCG/20ML IV SOLN
INTRAVENOUS | Status: AC
  Filled 2023-02-11: qty 20

## 2023-02-11 MED ORDER — GABAPENTIN 300 MG PO CAPS
ORAL_CAPSULE | ORAL | Status: AC
  Filled 2023-02-11: qty 1

## 2023-02-11 MED ORDER — FENTANYL CITRATE (PF) 100 MCG/2ML IJ SOLN
INTRAMUSCULAR | Status: DC | PRN
  Administered 2023-02-11: 100 ug via INTRAVENOUS
  Administered 2023-02-11: 50 ug via INTRAVENOUS

## 2023-02-11 MED ORDER — ROCURONIUM BROMIDE 10 MG/ML (PF) SYRINGE
PREFILLED_SYRINGE | INTRAVENOUS | Status: AC
  Filled 2023-02-11: qty 10

## 2023-02-11 MED ORDER — PHENAZOPYRIDINE HCL 100 MG PO TABS
ORAL_TABLET | ORAL | Status: AC
  Filled 2023-02-11: qty 2

## 2023-02-11 MED ORDER — KETOROLAC TROMETHAMINE 30 MG/ML IJ SOLN
INTRAMUSCULAR | Status: DC | PRN
  Administered 2023-02-11: 30 mg via INTRAVENOUS

## 2023-02-11 MED ORDER — ONDANSETRON HCL 4 MG PO TABS: 4.0000 mg | ORAL_TABLET | Freq: Four times a day (QID) | ORAL | Status: DC | PRN

## 2023-02-11 MED ORDER — DEXAMETHASONE SODIUM PHOSPHATE 10 MG/ML IJ SOLN
INTRAMUSCULAR | Status: DC | PRN
  Administered 2023-02-11: 4 mg via INTRAVENOUS

## 2023-02-11 MED ORDER — BUPIVACAINE HCL (PF) 0.25 % IJ SOLN
INTRAMUSCULAR | Status: DC | PRN
  Administered 2023-02-11: 15 mL

## 2023-02-11 MED ORDER — AMISULPRIDE (ANTIEMETIC) 5 MG/2ML IV SOLN: 10.0000 mg | Freq: Once | INTRAVENOUS | Status: DC | PRN

## 2023-02-11 MED ORDER — OXYCODONE HCL 5 MG PO TABS: 5.0000 mg | ORAL_TABLET | Freq: Once | ORAL | Status: DC | PRN

## 2023-02-11 MED ORDER — ONDANSETRON HCL 4 MG/2ML IJ SOLN: 4.0000 mg | Freq: Once | INTRAMUSCULAR | Status: DC | PRN

## 2023-02-11 MED ORDER — INSULIN ASPART 100 UNIT/ML IJ SOLN
INTRAMUSCULAR | Status: AC
  Filled 2023-02-11: qty 1

## 2023-02-11 MED ORDER — SODIUM CHLORIDE 0.9 % IV SOLN
INTRAVENOUS | Status: DC
Start: 2023-02-11 — End: 2023-02-11

## 2023-02-11 MED ORDER — ACETAMINOPHEN 500 MG PO TABS
1000.0000 mg | ORAL_TABLET | ORAL | Status: AC
  Administered 2023-02-11: 1000 mg via ORAL

## 2023-02-11 MED ORDER — LIDOCAINE-EPINEPHRINE 1 %-1:100000 IJ SOLN
INTRAMUSCULAR | Status: DC | PRN
  Administered 2023-02-11: 9 mL

## 2023-02-11 MED ORDER — STERILE WATER FOR IRRIGATION IR SOLN
Status: DC | PRN
  Administered 2023-02-11: 500 mL

## 2023-02-11 MED ORDER — ONDANSETRON HCL 4 MG/2ML IJ SOLN
INTRAMUSCULAR | Status: AC
  Filled 2023-02-11: qty 2

## 2023-02-11 MED ORDER — CEFAZOLIN SODIUM-DEXTROSE 2-4 GM/100ML-% IV SOLN
INTRAVENOUS | Status: AC
  Filled 2023-02-11: qty 100

## 2023-02-11 MED ORDER — GABAPENTIN 300 MG PO CAPS
300.0000 mg | ORAL_CAPSULE | ORAL | Status: AC
  Administered 2023-02-11: 300 mg via ORAL

## 2023-02-11 MED ORDER — DEXMEDETOMIDINE HCL IN NACL 80 MCG/20ML IV SOLN
INTRAVENOUS | Status: DC | PRN
  Administered 2023-02-11: 8 ug via INTRAVENOUS
  Administered 2023-02-11: 4 ug via INTRAVENOUS

## 2023-02-11 MED ORDER — PROPOFOL 10 MG/ML IV BOLUS
INTRAVENOUS | Status: AC
  Filled 2023-02-11: qty 20

## 2023-02-11 MED ORDER — INSULIN ASPART 100 UNIT/ML IJ SOLN
0.0000 [IU] | Freq: Three times a day (TID) | INTRAMUSCULAR | Status: DC
Start: 2023-02-11 — End: 2023-02-11

## 2023-02-11 MED ORDER — OXYCODONE HCL 5 MG/5ML PO SOLN: 5.0000 mg | Freq: Once | ORAL | Status: DC | PRN

## 2023-02-11 MED ORDER — SODIUM CHLORIDE 0.9 % IR SOLN
Status: DC | PRN
  Administered 2023-02-11: 400 mL via INTRAVESICAL

## 2023-02-11 MED ORDER — OXYCODONE HCL 5 MG PO TABS
ORAL_TABLET | ORAL | Status: AC
  Filled 2023-02-11: qty 1

## 2023-02-11 SURGICAL SUPPLY — 79 items
BAG URINE DRAIN 2000ML AR STRL (UROLOGICAL SUPPLIES) IMPLANT
BLADE SURG 15 STRL LF DISP TIS (BLADE) ×4 IMPLANT
CATH FOLEY 3WAY 5CC 16FR (CATHETERS) ×4 IMPLANT
CHLORAPREP W/TINT 26 (MISCELLANEOUS) ×4 IMPLANT
COVER BACK TABLE 60X90IN (DRAPES) ×4 IMPLANT
COVER TIP SHEARS 8 DVNC (MISCELLANEOUS) ×4 IMPLANT
DEFOGGER SCOPE WARMER CLEARIFY (MISCELLANEOUS) ×4 IMPLANT
DERMABOND ADVANCED .7 DNX12 (GAUZE/BANDAGES/DRESSINGS) ×4 IMPLANT
DRAPE ARM DVNC X/XI (DISPOSABLE) ×16 IMPLANT
DRAPE COLUMN DVNC XI (DISPOSABLE) ×4 IMPLANT
DRAPE SHEET LG 3/4 BI-LAMINATE (DRAPES) ×4 IMPLANT
DRAPE SURG IRRIG POUCH 19X23 (DRAPES) ×4 IMPLANT
DRAPE UTILITY XL STRL (DRAPES) ×4 IMPLANT
DRIVER NDL LRG 8 DVNC XI (INSTRUMENTS) ×4 IMPLANT
DRIVER NDL MEGA SUTCUT DVNCXI (INSTRUMENTS) ×4 IMPLANT
DRIVER NDLE LRG 8 DVNC XI (INSTRUMENTS) ×3 IMPLANT
DRIVER NDLE MEGA SUTCUT DVNCXI (INSTRUMENTS) ×3 IMPLANT
ELECT REM PT RETURN 9FT ADLT (ELECTROSURGICAL) ×3 IMPLANT
ELECTRODE REM PT RTRN 9FT ADLT (ELECTROSURGICAL) ×4 IMPLANT
FORCEPS BPLR 8 MD DVNC XI (FORCEP) ×4 IMPLANT
GAUZE 4X4 16PLY ~~LOC~~+RFID DBL (SPONGE) ×4 IMPLANT
GLOVE BIOGEL PI IND STRL 6.5 (GLOVE) ×16 IMPLANT
GLOVE BIOGEL PI IND STRL 7.0 (GLOVE) ×4 IMPLANT
GLOVE ECLIPSE 6.0 STRL STRAW (GLOVE) ×12 IMPLANT
GOWN STRL REUS W/TWL LRG LVL3 (GOWN DISPOSABLE) ×4 IMPLANT
GRASPER TIP-UP FEN DVNC XI (INSTRUMENTS) ×4 IMPLANT
HOLDER FOLEY CATH W/STRAP (MISCELLANEOUS) ×4 IMPLANT
IRRIG SUCT STRYKERFLOW 2 WTIP (MISCELLANEOUS) ×3 IMPLANT
IRRIGATION SUCT STRKRFLW 2 WTP (MISCELLANEOUS) ×4 IMPLANT
IV NS 1000ML BAXH (IV SOLUTION) IMPLANT
KIT PINK PAD W/HEAD ARE REST (MISCELLANEOUS) ×3 IMPLANT
KIT PINK PAD W/HEAD ARM REST (MISCELLANEOUS) ×4 IMPLANT
KIT TURNOVER CYSTO (KITS) ×4 IMPLANT
LEGGING LITHOTOMY PAIR STRL (DRAPES) ×4 IMPLANT
MANIFOLD NEPTUNE II (INSTRUMENTS) ×4 IMPLANT
MANIPULATOR ADVINCU DEL 2.5 PL (MISCELLANEOUS) IMPLANT
MANIPULATOR ADVINCU DEL 3.0 PL (MISCELLANEOUS) IMPLANT
MANIPULATOR ADVINCU DEL 3.5 PL (MISCELLANEOUS) IMPLANT
MANIPULATOR ADVINCU DEL 4.0 PL (MISCELLANEOUS) IMPLANT
MESH VERTESSA LITE -Y 2X4X3 (Mesh General) IMPLANT
NDL HYPO 22X1.5 SAFETY MO (MISCELLANEOUS) ×4 IMPLANT
NDL INSUFFLATION 14GA 120MM (NEEDLE) ×4 IMPLANT
NEEDLE HYPO 22X1.5 SAFETY MO (MISCELLANEOUS) ×3 IMPLANT
NEEDLE INSUFFLATION 14GA 120MM (NEEDLE) ×3 IMPLANT
NS IRRIG 1000ML POUR BTL (IV SOLUTION) ×4 IMPLANT
OBTURATOR OPTICAL STND 8 DVNC (TROCAR) ×3 IMPLANT
OBTURATOR OPTICALSTD 8 DVNC (TROCAR) ×4 IMPLANT
PACK CYSTO (CUSTOM PROCEDURE TRAY) ×4 IMPLANT
PACK ROBOT WH (CUSTOM PROCEDURE TRAY) ×4 IMPLANT
PACK ROBOTIC GOWN (GOWN DISPOSABLE) ×4 IMPLANT
PACK VAGINAL WOMENS (CUSTOM PROCEDURE TRAY) ×4 IMPLANT
PAD OB MATERNITY 4.3X12.25 (PERSONAL CARE ITEMS) ×4 IMPLANT
PAD PREP 24X48 CUFFED NSTRL (MISCELLANEOUS) ×4 IMPLANT
POUCH LAPAROSCOPIC INSTRUMENT (MISCELLANEOUS) IMPLANT
PROTECTOR NERVE ULNAR (MISCELLANEOUS) ×4 IMPLANT
SCISSORS MNPLR CVD DVNC XI (INSTRUMENTS) ×4 IMPLANT
SCRUB CHG 4% DYNA-HEX 4OZ (MISCELLANEOUS) ×4 IMPLANT
SEAL UNIV 5-12 XI (MISCELLANEOUS) ×20 IMPLANT
SEALER VESSEL EXT DVNC XI (MISCELLANEOUS) IMPLANT
SET IRRIG Y TYPE TUR BLADDER L (SET/KITS/TRAYS/PACK) ×4 IMPLANT
SET TUBE SMOKE EVAC HIGH FLOW (TUBING) ×4 IMPLANT
SLEEVE SCD COMPRESS KNEE MED (STOCKING) ×4 IMPLANT
SPIKE FLUID TRANSFER (MISCELLANEOUS) ×8 IMPLANT
SUCTION TUBE FRAZIER 10FR DISP (SUCTIONS) ×4 IMPLANT
SUT GORETEX NAB #0 THX26 36IN (SUTURE) ×4 IMPLANT
SUT MNCRL AB 4-0 PS2 18 (SUTURE) ×8 IMPLANT
SUT MON AB 2-0 SH 27 (SUTURE) ×4 IMPLANT
SUT V-LOC BARB 180 2/0GR9 GS23 (SUTURE) ×6 IMPLANT
SUT VIC AB 0 CT1 27XBRD ANBCTR (SUTURE) IMPLANT
SUT VIC AB 0 CT1 27XBRD ANTBC (SUTURE) ×8 IMPLANT
SUT VIC AB 2-0 SH 27XBRD (SUTURE) IMPLANT
SUT VIC AB 3-0 SH 18 (SUTURE) IMPLANT
SUT VICRYL 2-0 SH 8X27 (SUTURE) IMPLANT
SUT VLOC 180 0 9IN GS21 (SUTURE) ×4 IMPLANT
SUT VLOC 180 2-0 9IN GS21 (SUTURE) IMPLANT
SUTURE V-LC BRB 180 2/0GR9GS23 (SUTURE) ×8 IMPLANT
SYR BULB EAR ULCER 3OZ GRN STR (SYRINGE) ×4 IMPLANT
TOWEL OR 17X24 6PK STRL BLUE (TOWEL DISPOSABLE) ×4 IMPLANT
WATER STERILE IRR 500ML POUR (IV SOLUTION) IMPLANT

## 2023-02-11 NOTE — Discharge Instructions (Signed)

## 2023-02-11 NOTE — Anesthesia Postprocedure Evaluation (Signed)
Anesthesia Post Note  Patient: Shirly Graziano Prange  Procedure(s) Performed: XI ROBOTIC ASSISTED TOTAL HYSTERECTOMY WITH BILATERAL SALPINGO-OOPHORECTOMY AND SACROCOLPOPEXY (Abdomen) CYSTOSCOPY (Bladder) PERINEORRHAPHY (Perineum)     Patient location during evaluation: PACU Anesthesia Type: General Level of consciousness: awake and alert and oriented Pain management: pain level controlled Vital Signs Assessment: post-procedure vital signs reviewed and stable Respiratory status: spontaneous breathing, nonlabored ventilation and respiratory function stable Cardiovascular status: blood pressure returned to baseline and stable Postop Assessment: no apparent nausea or vomiting Anesthetic complications: no   No notable events documented.  Last Vitals:  Vitals:   02/11/23 1053 02/11/23 1100  BP: (!) 160/74 (!) 155/69  Pulse: 78 75  Resp: 16 16  Temp: (!) 35.8 C (!) 36.2 C  SpO2: 100% 99%    Last Pain:  Vitals:   02/11/23 0611  TempSrc: Oral                 Alishba Naples A.

## 2023-02-11 NOTE — Transfer of Care (Signed)
Immediate Anesthesia Transfer of Care Note  Patient: Sally [name]  Procedure(s) Performed: Procedure(s) (LRB): XI ROBOTIC ASSISTED TOTAL HYSTERECTOMY WITH BILATERAL SALPINGO-OOPHORECTOMY AND SACROCOLPOPEXY (N/A) CYSTOSCOPY (N/A) PERINEORRHAPHY (N/A)  Patient Location: PACU  Anesthesia Type: General  Level of Consciousness: awake, oriented, sedated and patient cooperative  Airway & Oxygen Therapy: Patient Spontanous Breathing and Patient connected to face mask oxygen  Post-op Assessment: Report given to PACU RN and Post -op Vital signs reviewed and stable  Post vital signs: Reviewed and stable  Complications: No apparent anesthesia complications Vitals Value Taken Time  BP 160/74 02/11/23 1048  Temp    Pulse 77 02/11/23 1054  Resp 20 02/11/23 1054  SpO2 100 % 02/11/23 1054  Vitals shown include unfiled device data.  Last Pain:  Vitals:   02/11/23 0611  TempSrc: Oral      Patients Stated Pain Goal: 6 (02/11/23 6644)  Complications: No notable events documented.

## 2023-02-11 NOTE — Interval H&P Note (Signed)
History and Physical Interval Note:  02/11/2023 7:23 AM  Sally [name]  has presented today for surgery, with the diagnosis of anterior vaginal prolapse; uterovaginal prolapse incomplete; posterior vaginal prolapse.  The various methods of treatment have been discussed with the patient and family. After consideration of risks, benefits and other options for treatment, the patient has consented to  Procedure(s) with comments: XI ROBOTIC ASSISTED TOTAL HYSTERECTOMY WITH BILATERAL SALPINGO-OOPHORECTOMY AND SACROCOLPOPEXY (N/A)  CYSTOSCOPY (N/A) POSSIBLE PERINEORRHAPHY (N/A) as a surgical intervention.  The patient's history has been reviewed, patient examined, no change in status, stable for surgery.  I have reviewed the patient's chart and labs.  Questions were answered to the patient's satisfaction.     Marguerita Beards

## 2023-02-11 NOTE — Anesthesia Procedure Notes (Signed)
Procedure Name: Intubation Date/Time: 02/11/2023 7:44 AM  Performed by: Bishop Limbo, CRNAPre-anesthesia Checklist: Patient identified, Emergency Drugs available, Suction available and Patient being monitored Patient Re-evaluated:Patient Re-evaluated prior to induction Oxygen Delivery Method: Circle System Utilized Preoxygenation: Pre-oxygenation with 100% oxygen Induction Type: IV induction Ventilation: Mask ventilation without difficulty Laryngoscope Size: Mac and 3 Grade View: Grade I Tube type: Oral Tube size: 7.0 mm Number of attempts: 1 Airway Equipment and Method: Stylet Placement Confirmation: ETT inserted through vocal cords under direct vision, positive ETCO2 and breath sounds checked- equal and bilateral Secured at: 22 cm Tube secured with: Tape Dental Injury: Teeth and Oropharynx as per pre-operative assessment

## 2023-02-11 NOTE — Op Note (Signed)
Operative Note  Preoperative Diagnosis: anterior vaginal prolapse, posterior vaginal prolapse, and uterovaginal prolapse, incomplete  Postoperative Diagnosis: same  Procedures performed:  Robotic assisted total laparoscopic hysterectomy with bilateral salpingo-oophorectomy, sacrocolpopexy Lorna Few Lite Y), cystoscopy, perineorrhaphy  Implants:  Implant Name Type Inv. Item Serial No. Manufacturer Lot No. LRB No. Used Action  MESH Grayland Ormond [id number] Mesh General MESH Arlys John  [facility] MEDICAL [phone] N/A 1 Implanted    Attending Surgeon: Lanetta Inch, MD  Assistant Surgeon: Jay Schlichter, MD  Anesthesia: General endotracheal  Findings: 1. On vaginal exam, stage III prolapse noted  2. On laparoscopy, normal appearing uterus and fallopian tubes, bilateral cystic ovaries  3. On cystoscopy, normal bladder and urethra without injury or lesion. Brisk bilateral ureteral efflux noted.    Specimens:  ID Type Source Tests Collected by Time Destination  1 : Uterus, cervix, bilateral fallopian tubes, bilateral ovaries Tissue PATH Gyn benign resection SURGICAL PATHOLOGY Marguerita Beards, MD 02/11/2023 0900     Estimated blood loss: 25 mL  IV fluids: 800 mL  Urine output: 600 mL  Complications: none  Procedure in Detail:   After informed consent was obtained, the patient was taken to the operating room, where general anesthesia was induced and found to be adequate. She was placed in dorsolithotomy position in yellowfin stirrups. Her hips were noted not to be hyperflexed or hyperextended. Her arms were padded with gel pads and tucked to her sides. Her hands were surrounded by foam. A padded strap was placed across her chest with foam between the pad and her skin. She was noted to be appropriately positioned with all pressure points well padded and off tension. A tilt test showed no slippage. She was prepped and draped in the usual sterile fashion. A uterine  manipulator was placed in the uterus after sounding to 8 cm, an appropriately sized Koh ring was placed around the cervix, and a pneumo-occluder balloon was positioned in the vagina for later use.  A sterile Foley catheter was inserted.   0.25% plain Marcaine was injected in the supraumbilical  area and an 8 mm supraumbilical skin incision was made with the scalpel.  A Veress needle was inserted into the incision, CO2 insufflation was started, a low opening pressure was noted, and pneumoperitoneum was obtained. The Veress needle was removed and a 8mm robotic trocar was placed. The robotic camera was inserted and intraperitoneal placement was confirmed. Survey of the abdomen and pelvis revealed the findings as noted above. The sacrum appeared to be free of any adhesive disease. After determining placement for the other ports, Local anesthetic was injected at each site and two 8 mm incisions were made for robotic ports at 10 cm lateral to and at the level of the umbilical port. Two additional 8 mm incisions were made 10 cm lateral to these and 30 degrees down followed by 8 mm robotic ports - the right side for an assistant port. All trocars were placed sequentially under direct visualization of the camera. The patient was placed in Trendelenburg. The robot was docked on the patient's right side. Monopolar endoshears alternating with the vessel sealer was placed in the right arm, a [place] bipolar grasper was placed in the 2nd arm of the patient's left side, and a Tip up grasper was placed in the 3rd arm on the patient's left side.   Attention was then turned to the sacral promontory. The peritoneum overlying the sacral promontory was tented up, dissected sharply with monopolar  scissors and electrosurgery using layer by layer technique. The peritoneal incision was extended down to the posterior cul-de-sac. This was performed with care to avoid the ureter on the right side and the sigmoid colon and its mesentary on  the left side. Two transverse sutures of CV2 Gortex were placed in the anterior longitudinal ligament.  Attention was then turned to the robotic hysterectomy and BSO. The ureter was identified and was found to be well away from the planned site of incision. Using the monopolar scissors, a window was made on the posterior leaf of the broad ligament. The infundibulopelvic ligament was cauterized and transected. The right round ligament was grasped, cauterized, and transected with electrocautery. The anterior and posterior leaves of the broad ligament were taken down with cautery and sharp dissection. The uterine artery was skeletonized and the bladder flap was created on the right side with a combination of electrosurgery and sharp dissection. The KOH ring was identified. The right uterine artery was clamped, cauterized, and transected. In a similar fashion, the left side was taken down. Further sharp dissection with combination of cautery was performed to further develop the bladder flap. At this point, the KOH ring was completely hugging the cervix. The pneumo-occluder balloon in the vagina was inflated to maintain pneumoperitoneum. A colpotomy was performed with electrosurgical cutting current and the uterus and cervix were completely amputated from the vagina. The specimen was delivered through the vagina. The posterior portion of the vaginal cuff was then grasped and pulled up to maintain pneumoperitoneum. The pneumo-occluder balloon was then replaced in the vagina. The right hand instrument was changed to a suture-cut needle driver. The vaginal cuff was then closed using a 0 V-lock suture in two layers.    The right hand instrument was replaced with monopolar endoshears. With a lucite probe in the vagina, the anterior vaginal dissection was then performed with sharp dissection and electrosurgery. The posterior vaginal dissection was then performed with sharp dissection and electrosurgery in order to dissect  the rectum away from the posterior vagina. During this portion, an EA sizer was placed to delineate the rectum.  A "Y" mesh was then inserted into the abdomen after trimming to appropriate size. With the probe in the vagina, the anterior leaf of the Y mesh was affixed to the anterior portion of the vagina using a 2-0 v-loc suture in a spiral pattern to distribute the suture evenly across the surface of the anterior mesh leaf. In a similar fashion, the posterior leaf of the Y mesh was attached to the posterior surface of the vagina with 2-0 v-loc suture.  The distal end of the mesh was then brought to overlie the sacrum. The correct amount of tension was determined in order to elevate the vagina, but not put the mesh under tension. The distal end of the mesh was then affixed to the anterior longitudinal sacral ligament using two interrupted transverse stitches of CV2 Gortex. The excess distal mesh was then cut and removed. The peritoneum was reapproximated over the mesh using 2-0 monocryl. The bladder flap was incorporated to completely retroperitonealize the mesh. All pedicles were carefully inspected and noted to be hemostatic as the CO2 gas was deflated. All instruments were removed from the patient's abdomen.   The Foley catheter was removed.  A 70-degree cystoscope was introduced, and 360-degree inspection revealed no injury, lesion or foreign body in the bladder. Brisk bilateral ureteral efflux was noted with the assistance of pyridium.  The bladder was drained and the cystoscope  was removed.  The Foley catheter was replaced.  The robot was undocked. The CO2 gas was removed and the ports were removed.  The skin incisions were closed with subcutaneous stitches of 4-0 Monocryl and covered with skin glue.    Attention was then turned to the perineum. Two allis clamps were placed at the introitus. The perineum was injected with 1% lidocaine with epinephrine. A diamond shaped incision was made over the  perineum and excess skin was removed. Dissection was performed with Metzenbaum scissors to separate the mucosa from the underlying tissue. The perineal body was then reapproximated with two interrupted 0-vicryl sutures. The perineal skin was then closed with a 2-0 vicryl in a subcutaneous and subcuticular fashion. Irrigation was performed and good hemostasis was noted. Sponge, lap, and needle counts were correct x 2. The patient tolerated the procedure well. She was awakened from anesthesia and transferred to the recovery room in stable condition.    Marguerita Beards, MD

## 2023-02-12 ENCOUNTER — Encounter (HOSPITAL_BASED_OUTPATIENT_CLINIC_OR_DEPARTMENT_OTHER): Payer: Self-pay | Admitting: Obstetrics and Gynecology

## 2023-02-12 LAB — SURGICAL PATHOLOGY

## 2023-03-05 DIAGNOSIS — H524 Presbyopia: Secondary | ICD-10-CM | POA: Diagnosis not present

## 2023-03-12 DIAGNOSIS — E119 Type 2 diabetes mellitus without complications: Secondary | ICD-10-CM | POA: Diagnosis not present

## 2023-03-12 DIAGNOSIS — Z794 Long term (current) use of insulin: Secondary | ICD-10-CM | POA: Diagnosis not present

## 2023-03-12 DIAGNOSIS — I10 Essential (primary) hypertension: Secondary | ICD-10-CM | POA: Diagnosis not present

## 2023-03-12 DIAGNOSIS — E785 Hyperlipidemia, unspecified: Secondary | ICD-10-CM | POA: Diagnosis not present

## 2023-03-26 ENCOUNTER — Ambulatory Visit (INDEPENDENT_AMBULATORY_CARE_PROVIDER_SITE_OTHER): Payer: Medicare Other | Admitting: Obstetrics and Gynecology

## 2023-03-26 ENCOUNTER — Encounter: Payer: Self-pay | Admitting: Obstetrics and Gynecology

## 2023-03-26 VITALS — BP 170/90 | HR 77

## 2023-03-26 DIAGNOSIS — Z48816 Encounter for surgical aftercare following surgery on the genitourinary system: Secondary | ICD-10-CM

## 2023-03-26 DIAGNOSIS — N3281 Overactive bladder: Secondary | ICD-10-CM

## 2023-03-26 DIAGNOSIS — Z9889 Other specified postprocedural states: Secondary | ICD-10-CM

## 2023-03-26 NOTE — Progress Notes (Signed)
[facility]  Date of Visit: 03/26/2023  History of Present Illness: Sally [name] is a 78 y.o. female scheduled today for a post-operative visit.   Surgery: s/p Robotic assisted total laparoscopic hysterectomy with bilateral salpingo-oophorectomy, sacrocolpopexy Lorna Few Lite Y), cystoscopy, perineorrhaphy on 02/11/23  She passed her postoperative void trial.   Postoperative course has been uncomplicated.   Today she reports she is feeling well. Feels the surgery is successful.   UTI in the last 6 weeks? No  Pain? No  She has not returned to her normal activity (except for postop restrictions) Vaginal bulge? No  Stress incontinence: No  Urgency/frequency: Yes - has been noticing a little more but not that bothersome.  Urge incontinence: Yes  Voiding dysfunction: No  Bowel issues: No , does have some occasional constipation. Has started a fiber supplement. She stopped the miralax for a while but then has restarted.   Subjective Success: Do you usually have a bulge or something falling out that you can see or feel in the vaginal area? No  Retreatment Success: Any retreatment with surgery or pessary for any compartment? No   Pathology results: UTERUS, CERVIX, BILATERAL FALLOPIAN TUBES AND OVARIES, RESECTION:  - Uterus with benign inactive endometrium  - Benign unremarkable cervix  - Benign unremarkable bilateral fallopian tubes  - Benign bilateral ovaries with benign serous cystadenoma  - No evidence of malignancy    Medications: She has a current medication list which includes the following prescription(s): acetaminophen, amoxicillin-clavulanate, bepotastine besilate, cetirizine, vitamin d3, cyanocobalamin, diphenhydramine-acetaminophen, synjardy, estradiol, humalog kwikpen, ibuprofen, ibuprofen-diphenhydramine cit, ipratropium, lantus solostar, losartan, oxycodone, polyethylene glycol powder, rosuvastatin, and UNABLE TO FIND.   Allergies: Patient is allergic to codeine,  lipitor [atorvastatin], and zocor [simvastatin].   Physical Exam: BP (!) 170/90   Pulse 77   Abdomen: soft, non-tender, without masses or organomegaly Laparoscopic Incisions: healing well.  Pelvic Examination: Vagina: perineal Incisions healing well. Sutures are present at the cuff and there is not granulation tissue. No tenderness along the anterior or posterior vagina. No apical tenderness. No pelvic masses. No visible or palpable mesh.  POP-Q: POP-Q  -3                                            Aa   -3                                           Ba  -9                                              C   3.5                                            Gh  4                                            Pb  9  tvl   -1                                            Ap  -1                                            Bp                                                 D    ---------------------------------------------------------  Assessment and Plan:  1. Post-operative state   2. Overactive bladder     - Pathology results were reviewed with the patient today and she verbalized understanding that the results were benign.  - Can resume regular activity including exercise. Wait an additional 6 weeks prior to intercourse.  - Discussed avoidance of heavy lifting and straining long term to reduce the risk of recurrence.  - We discussed option for bladder urgency of medication. She does not feel it is that bothersome and would like to wait a little longer before trying something.   Return as needed

## 2023-04-10 DIAGNOSIS — K080 Exfoliation of teeth due to systemic causes: Secondary | ICD-10-CM | POA: Diagnosis not present

## 2023-06-12 DIAGNOSIS — I10 Essential (primary) hypertension: Secondary | ICD-10-CM | POA: Diagnosis not present

## 2023-06-12 DIAGNOSIS — Z794 Long term (current) use of insulin: Secondary | ICD-10-CM | POA: Diagnosis not present

## 2023-06-12 DIAGNOSIS — E785 Hyperlipidemia, unspecified: Secondary | ICD-10-CM | POA: Diagnosis not present

## 2023-06-12 DIAGNOSIS — E119 Type 2 diabetes mellitus without complications: Secondary | ICD-10-CM | POA: Diagnosis not present

## 2023-07-02 DIAGNOSIS — Z6832 Body mass index (BMI) 32.0-32.9, adult: Secondary | ICD-10-CM | POA: Diagnosis not present

## 2023-07-02 DIAGNOSIS — J329 Chronic sinusitis, unspecified: Secondary | ICD-10-CM | POA: Diagnosis not present

## 2023-07-11 DIAGNOSIS — J329 Chronic sinusitis, unspecified: Secondary | ICD-10-CM | POA: Diagnosis not present

## 2023-07-11 DIAGNOSIS — J309 Allergic rhinitis, unspecified: Secondary | ICD-10-CM | POA: Diagnosis not present

## 2023-07-19 DIAGNOSIS — J329 Chronic sinusitis, unspecified: Secondary | ICD-10-CM | POA: Diagnosis not present

## 2023-07-23 DIAGNOSIS — K080 Exfoliation of teeth due to systemic causes: Secondary | ICD-10-CM | POA: Diagnosis not present

## 2023-07-25 DIAGNOSIS — E119 Type 2 diabetes mellitus without complications: Secondary | ICD-10-CM | POA: Diagnosis not present

## 2023-07-25 DIAGNOSIS — Z6831 Body mass index (BMI) 31.0-31.9, adult: Secondary | ICD-10-CM | POA: Diagnosis not present

## 2023-07-25 DIAGNOSIS — B3731 Acute candidiasis of vulva and vagina: Secondary | ICD-10-CM | POA: Diagnosis not present

## 2023-08-30 DIAGNOSIS — N958 Other specified menopausal and perimenopausal disorders: Secondary | ICD-10-CM | POA: Diagnosis not present

## 2023-08-30 DIAGNOSIS — M8588 Other specified disorders of bone density and structure, other site: Secondary | ICD-10-CM | POA: Diagnosis not present

## 2023-08-30 DIAGNOSIS — E2839 Other primary ovarian failure: Secondary | ICD-10-CM | POA: Diagnosis not present

## 2023-09-13 ENCOUNTER — Other Ambulatory Visit: Payer: Self-pay | Admitting: Family Medicine

## 2023-09-13 DIAGNOSIS — Z1231 Encounter for screening mammogram for malignant neoplasm of breast: Secondary | ICD-10-CM

## 2023-09-26 ENCOUNTER — Ambulatory Visit: Admitting: Allergy

## 2023-09-26 ENCOUNTER — Encounter: Payer: Self-pay | Admitting: Allergy

## 2023-09-26 ENCOUNTER — Other Ambulatory Visit: Payer: Self-pay

## 2023-09-26 VITALS — BP 122/64 | HR 87 | Temp 97.3°F | Resp 19 | Ht 62.5 in | Wt 175.2 lb

## 2023-09-26 DIAGNOSIS — J309 Allergic rhinitis, unspecified: Secondary | ICD-10-CM

## 2023-09-26 DIAGNOSIS — H1013 Acute atopic conjunctivitis, bilateral: Secondary | ICD-10-CM

## 2023-09-26 MED ORDER — IPRATROPIUM BROMIDE 0.06 % NA SOLN: 1.0000 | Freq: Four times a day (QID) | NASAL | 5 refills | Status: AC | PRN

## 2023-09-26 NOTE — Patient Instructions (Addendum)
 Environmental allergies / Allergic rhinoconjunctivitis -Return for skin testing (make appointment at the front) -Stop taking all pill antihistamines 3 days prior to skin testing -You may use Ipratropium nasal spray 1 spray each nose up to 4 times a day -Pataday eye drop 1 drop each eye per day -Stop taking tylenol  PM (Unasom may be a better option, try splitting 1 pill in half)

## 2023-09-26 NOTE — Progress Notes (Unsigned)
 New Patient Note  RE: Sally [name] MRN: [id number] DOB: Sep 14, 1945 Date of Office Visit: 09/26/2023  Primary care provider: Katina Pfeiffer, PA-C  Chief Complaint: Allergies  History of present illness: Sally [name] is a 78 y.o. female presenting today for evaluation of environmental allergies. She has formerly tested positive to dust, trees, and molds, although she recalls that she had positive results to control skin testing.   The patient says that for many years, since moving to [place] about 40 years ago, she has had congestion, posterior nasal drainage, itchy and watery eyes. Her symptoms are year round, but worse in the Fall or with outdoor exposure. However, she continues to have symptoms with doors being closed. Her mucus drainage sometimes causes her to clear her throat with sputum production. She has a history of recurrent sinus infections, usually about 2 a year. Refresh eye drops seem to help her eye itching. If she gardens, she will develop itchy arms. After this happens, she takes benadryl. The patient also takes a nightly Tylenol  PM with benadryl. She took Zyrtec for years and switched to Claritin a few months ago. As needed, she will use flonase and atrovent  nasal spray until her symptoms alleviate.    Review of systems: Review of Systems  HENT:  Positive for congestion, postnasal drip and sinus pressure.   Eyes:  Positive for redness and itching.       Positive for eye watering  Allergic/Immunologic: Positive for environmental allergies.    All other systems negative unless noted above in HPI  Past medical history: Past Medical History:  Diagnosis Date   Allergic rhinitis    Diabetes (HCC)    type 2, Follows w/ Dr. Faythe, endocrinologist   High cholesterol    History of kidney stones    Hypertension    Follows w/ PCP at [facility].   Prolapse of female pelvic organs    Right ureteral stone 2020    Past surgical history: Past Surgical  History:  Procedure Laterality Date   ADENOIDECTOMY     CATARACT EXTRACTION Bilateral 2016-2017   colonscopy  2020   polyps removed   CYSTOSCOPY N/A 02/11/2023   Procedure: CYSTOSCOPY;  Surgeon: Marilynne Rosaline SAILOR, MD;  Location[facility];  Service: Gynecology;  Laterality: N/A;   CYSTOSCOPY/URETEROSCOPY/HOLMIUM LASER/STENT PLACEMENT Right 12/08/2018   Procedure: CYSTOSCOPY/URETEROSCOPY/HOLMIUM LASER/STENT PLACEMENT;  Surgeon: Carolee Sherwood JONETTA DOUGLAS, MD;  Location: [facility];  Service: Urology;  Laterality: Right;   PERINEOPLASTY N/A 02/11/2023   Procedure: PERINEORRHAPHY;  Surgeon: Marilynne Rosaline SAILOR, MD;  Location: [facility];  Service: Gynecology;  Laterality: N/A;   TONSILLECTOMY     TUBAL LIGATION     XI ROBOTIC ASSISTED TOTAL HYSTERECTOMY WITH SACROCOLPOPEXY N/A 02/11/2023   Procedure: XI ROBOTIC ASSISTED TOTAL HYSTERECTOMY WITH BILATERAL SALPINGO-OOPHORECTOMY AND SACROCOLPOPEXY;  Surgeon: Marilynne Rosaline SAILOR, MD;  Location: [facility];  Service: Gynecology;  Laterality: N/A;  Total time requested is 3 hours    Family history:  Family History  Problem Relation Age of Onset   Cancer Mother    Heart attack Father    Allergic rhinitis Neg Hx    Asthma Neg Hx    Breast cancer Neg Hx     Social history: Lives in a home without carpeting with gas heating and central cooling.  No pets in the home.  No concern for water  damage, mildew or roaches in the home.  She is retired.  No  smoking history.    Medication List: Current Outpatient Medications  Medication Sig Dispense Refill   acetaminophen  (TYLENOL ) 500 MG tablet Take 1 tablet (500 mg total) by mouth every 6 (six) hours as needed (pain). 30 tablet 0   cetirizine (ZYRTEC) 10 MG tablet Take 10 mg by mouth daily.     Cholecalciferol (VITAMIN D3) 2000 units TABS Take by mouth as needed.     Cyanocobalamin  (VITAMIN B 12 PO) Take by mouth. 1000 mg 3 x week      diphenhydramine-acetaminophen  (TYLENOL  PM) 25-500 MG TABS tablet Take 1 tablet by mouth at bedtime as needed.     Empagliflozin-metFORMIN HCl (SYNJARDY) 12.07-998 MG TABS Take by mouth.     estradiol  (ESTRACE ) 0.1 MG/GM vaginal cream Place 0.5g nightly for two weeks then twice a week after 30 g 11   HUMALOG KWIKPEN 100 UNIT/ML KiwkPen INJ 1 UNIT [place] PER 10  GRAMS OF CARBS AT MEALS PLUS 1 EXTRA UNIT FOR EVERY 20 MG/DL ABOVE 899 MG/DL. MAX OF 40 UNITS PER DAY  2   ibuprofen  (ADVIL ) 600 MG tablet Take 1 tablet (600 mg total) by mouth every 6 (six) hours as needed. 30 tablet 0   Ibuprofen -diphenhydrAMINE Cit (ADVIL  PM PO) Take by mouth at bedtime as needed.     ipratropium (ATROVENT ) 0.03 % nasal spray Place 2 sprays into both nostrils every 12 (twelve) hours.     LANTUS SOLOSTAR 100 UNIT/ML Solostar Pen 12 units in am and 12 units in pm  0   losartan (COZAAR) 25 MG tablet Take 25 mg by mouth daily.     oxyCODONE  (OXY IR/ROXICODONE ) 5 MG immediate release tablet Take 1 tablet (5 mg total) by mouth every 4 (four) hours as needed for severe pain (pain score 7-10). 15 tablet 0   polyethylene glycol powder (GLYCOLAX /MIRALAX ) 17 GM/SCOOP powder Take 17 g by mouth daily. Drink 17g (1 scoop) dissolved in water  per day. 255 g 0   rosuvastatin (CRESTOR) 10 MG tablet at bedtime.   1   UNABLE TO FIND zquill I cap at bedtime     amoxicillin -clavulanate (AUGMENTIN ) 875-125 MG tablet Take 1 tablet by mouth 2 (two) times daily. (Patient not taking: Reported on 09/26/2023) 20 tablet 0   Bepotastine  Besilate (BEPREVE) 1.5 % SOLN Use one drop in each eye twice daily if needed (Patient not taking: Reported on 09/26/2023) 10 mL 5   No current facility-administered medications for this visit.    Known medication allergies: Allergies  Allergen Reactions   Codeine Shortness Of Breath   Lipitor [Atorvastatin] Other (See Comments)    Muscle pain    Zocor [Simvastatin] Other (See Comments)    Muscle pain     Physical  examination: Blood pressure 122/64, pulse 87, temperature (!) 97.3 F (36.3 C), resp. rate 19, height 5' 2.5 (1.588 m), weight 175 lb 3.2 oz (79.5 kg), SpO2 94%.  General: Alert, interactive, in no acute distress. HEENT: PERRLA, TMs pearly gray, turbinates are not hypertrophied although right naris is smaller. Right nare is patent. Neck: Supple without lymphadenopathy. Lungs: Clear to auscultation without wheezing, rhonchi or rales. {no increased work of breathing. CV: Normal S1, S2 without murmurs. Abdomen: Nondistended, nontender. Skin: Warm and dry, without lesions or rashes. Extremities:  No clubbing, cyanosis or edema. Neuro:   Grossly intact.  Diagnostics/Labs: Labs: None  Spirometry: None  Allergy testing: Return for allergy testing    Assessment and plan: Environmental allergies / Allergic rhinoconjunctivitis -Return for skin testing  -  env 1-55 -Stop taking all pill antihistamines 3 days prior to skin testing -You may use Ipratropium nasal spray 1 spray each nose up to 4 times a day -Pataday eye drop 1 drop each eye per day -Stop taking tylenol  PM (Unasom may be a better option, try splitting 1 pill in half)  Sally [name] presents with a history of environmental allergies tested in 2018 positive to tree pollen, molds, and dust mites. She continues to have sinus infections twice a year and nasal congestion/drainage year round, with sudden itchiness and watering of the eyes with outdoor exposures. She also maintains arm itchiness with gardening and feels that her symptoms have increased over the past couple of years. I suspect that along with oral antihistamine, PRN nasal steroid and anticholinergic, she would benefit from SCIT. Will update skin testing in follow up as her last testing was 7.5 years ago and she is interested in immunotherapy.  I appreciate the opportunity to take part in Dawnette's care. Please do not hesitate to contact me with questions.  Sincerely,  Donnice Mutter, MS4 [facility]  Danita Brain, MD Allergy/Immunology [facility]

## 2023-10-04 ENCOUNTER — Ambulatory Visit: Admitting: Allergy

## 2023-10-04 ENCOUNTER — Encounter: Payer: Self-pay | Admitting: Allergy

## 2023-10-04 DIAGNOSIS — J3089 Other allergic rhinitis: Secondary | ICD-10-CM | POA: Diagnosis not present

## 2023-10-04 DIAGNOSIS — H1013 Acute atopic conjunctivitis, bilateral: Secondary | ICD-10-CM | POA: Diagnosis not present

## 2023-10-04 DIAGNOSIS — H1010 Acute atopic conjunctivitis, unspecified eye: Secondary | ICD-10-CM

## 2023-10-04 NOTE — Progress Notes (Signed)
 Follow-up Note  RE: Sally [name] MRN: [id number] DOB: [birth year] Date of Office Visit: 10/04/2023   History of present illness: Sally [name] is a 78 y.o. female presenting today for skin testing visit.  She was last seen in the office on 09/26/23 for rhinoconjunctivitis.  She is in her usual state of health today without recent illness.  She has held antihistamines for at least 3 days for testing today.   Medication List: Current Outpatient Medications  Medication Sig Dispense Refill   acetaminophen  (TYLENOL ) 500 MG tablet Take 1 tablet (500 mg total) by mouth every 6 (six) hours as needed (pain). 30 tablet 0   amoxicillin -clavulanate (AUGMENTIN ) 875-125 MG tablet Take 1 tablet by mouth 2 (two) times daily. (Patient not taking: Reported on 09/26/2023) 20 tablet 0   Bepotastine  Besilate (BEPREVE) 1.5 % SOLN Use one drop in each eye twice daily if needed (Patient not taking: Reported on 09/26/2023) 10 mL 5   cetirizine (ZYRTEC) 10 MG tablet Take 10 mg by mouth daily.     Cholecalciferol (VITAMIN D3) 2000 units TABS Take by mouth as needed.     Cyanocobalamin  (VITAMIN B 12 PO) Take by mouth. 1000 mg 3 x week     diphenhydramine-acetaminophen  (TYLENOL  PM) 25-500 MG TABS tablet Take 1 tablet by mouth at bedtime as needed.     Empagliflozin-metFORMIN HCl (SYNJARDY) 12.07-998 MG TABS Take by mouth.     estradiol  (ESTRACE ) 0.1 MG/GM vaginal cream Place 0.5g nightly for two weeks then twice a week after 30 g 11   HUMALOG KWIKPEN 100 UNIT/ML KiwkPen INJ 1 UNIT [place] PER 10  GRAMS OF CARBS AT MEALS PLUS 1 EXTRA UNIT FOR EVERY 20 MG/DL ABOVE 899 MG/DL. MAX OF 40 UNITS PER DAY  2   ibuprofen  (ADVIL ) 600 MG tablet Take 1 tablet (600 mg total) by mouth every 6 (six) hours as needed. 30 tablet 0   Ibuprofen -diphenhydrAMINE Cit (ADVIL  PM PO) Take by mouth at bedtime as needed.     ipratropium (ATROVENT ) 0.03 % nasal spray Place 2 sprays into both nostrils every 12 (twelve) hours.      ipratropium (ATROVENT ) 0.06 % nasal spray Place 1 spray into both nostrils 4 (four) times daily as needed for rhinitis. 15 mL 5   LANTUS SOLOSTAR 100 UNIT/ML Solostar Pen 12 units in am and 12 units in pm  0   losartan (COZAAR) 25 MG tablet Take 25 mg by mouth daily.     oxyCODONE  (OXY IR/ROXICODONE ) 5 MG immediate release tablet Take 1 tablet (5 mg total) by mouth every 4 (four) hours as needed for severe pain (pain score 7-10). 15 tablet 0   polyethylene glycol powder (GLYCOLAX /MIRALAX ) 17 GM/SCOOP powder Take 17 g by mouth daily. Drink 17g (1 scoop) dissolved in water  per day. 255 g 0   rosuvastatin (CRESTOR) 10 MG tablet at bedtime.   1   UNABLE TO FIND zquill I cap at bedtime     No current facility-administered medications for this visit.     Known medication allergies: Allergies  Allergen Reactions   Codeine Shortness Of Breath   Lipitor [Atorvastatin] Other (See Comments)    Muscle pain    Zocor [Simvastatin] Other (See Comments)    Muscle pain   Diagnostics/Labs:  Allergy testing:   Airborne Adult Perc - 10/04/23 0929     Time Antigen Placed 9070    Allergen Manufacturer Jestine    Location Back    Number of Test 55  1. Control-Buffer 50% Glycerol Negative    2. Control-Histamine 2+    3. [place] 2+    4. [place] Negative    5. Johnson Negative    6. [place]  Blue Negative    7. Meadow Fescue 2+    8. Perennial Rye Negative    9. Timothy 2+    10. Ragweed Mix Negative    11. Cocklebur 2+    12. Plantain,  English Negative    13. Baccharis Negative    14. Dog Fennel Negative    15. Russian Thistle Negative    16. Lamb's Quarters Negative    17. Sheep Sorrell Negative    18. Rough Pigweed Negative    19. Marsh Elder, Rough Negative    20. Mugwort, Common Negative    21. Box, Elder Negative    22. Cedar, red Negative    23. Sweet Gum Negative    24. Pecan Pollen Negative    25. Pine Mix Negative    26. Walnut, Black Pollen Negative    27. Red Mulberry  Negative    28. Ash Mix Negative    29. Birch Mix Negative    30. Beech American Negative    31. Cottonwood, [place] Negative    32. Hickory, White Negative    33. Maple Mix Negative    34. Oak, [place] Mix Negative    35. Sycamore Eastern Negative    37. Cladosporium Herbarum Negative    38. Aspergillus Mix Negative    39. Penicillium Mix Negative    40. Bipolaris Sorokiniana (Helminthosporium) Negative    41. Drechslera Spicifera (Curvularia) Negative    42. Mucor Plumbeus Negative    43. Fusarium Moniliforme Negative    44. Aureobasidium Pullulans (pullulara) Negative    45. Rhizopus Oryzae Negative    46. Botrytis Cinera Negative    47. Epicoccum Nigrum Negative    48. Phoma Betae Negative    49. Dust Mite Mix Negative    50. Cat Hair 10,000 BAU/ml Negative    51.  Dog Epithelia Negative    52. Mixed Feathers Negative    53. Horse Epithelia Negative    54. Cockroach, German Negative    55. Tobacco Leaf Negative          Allergy testing results were read and interpreted by provider, documented by clinical staff.   Assessment and plan: Allergic rhinoconjunctivitis - Use Ipratropium nasal spray 1-2 spray each nostril up to 4 times a day for nasal drainage/congestion.   With using nasal sprays point tip of bottle toward eye on same side nostril and lean head slightly forward for best technique.   - Recommend use of Xyzal 5mg  daily as needed.  This is usually more effective than claritin and zyrtec - Pataday eye drop 1 drop each eye per day as needed for itchy/watery eyes - Testing today showed: grasses, weeds, and outdoor molds - Copy of test results provided.  - Avoidance measures provided. - Consider allergy shots as a means of long-term control. - Allergy shots re-train and reset the immune system to ignore environmental allergens and decrease the resulting immune response to those allergens (sneezing, itchy watery eyes, runny nose, nasal congestion, etc).    -  Allergy shots improve symptoms in 75-85% of patients.  - We can discuss more at a future appointment if the medications are not working for you.  Follow-up in 6-12 months or sooner if needed I appreciate the opportunity to take part in [facility]'s care. Please  do not hesitate to contact me with questions.  Sincerely,   Danita Brain, MD Allergy/Immunology [facility]

## 2023-10-04 NOTE — Patient Instructions (Signed)
 Environmental allergies / Allergic rhinoconjunctivitis - Use Ipratropium nasal spray 1-2 spray each nostril up to 4 times a day for nasal drainage/congestion.   With using nasal sprays point tip of bottle toward eye on same side nostril and lean head slightly forward for best technique.   - Recommend use of Xyzal 5mg  daily as needed.  This is usually more effective than claritin and zyrtec - Pataday eye drop 1 drop each eye per day as needed for itchy/watery eyes - Testing today showed: grasses, weeds, and outdoor molds - Copy of test results provided.  - Avoidance measures provided. - Consider allergy shots as a means of long-term control. - Allergy shots re-train and reset the immune system to ignore environmental allergens and decrease the resulting immune response to those allergens (sneezing, itchy watery eyes, runny nose, nasal congestion, etc).    - Allergy shots improve symptoms in 75-85% of patients.  - We can discuss more at a future appointment if the medications are not working for you.  Follow-up in 6-12 months or sooner if needed

## 2023-10-08 ENCOUNTER — Ambulatory Visit

## 2023-11-22 ENCOUNTER — Ambulatory Visit
Admission: RE | Admit: 2023-11-22 | Discharge: 2023-11-22 | Disposition: A | Discharge status: Home / Self Care | Source: Ambulatory Visit | Attending: Family Medicine | Admitting: Family Medicine

## 2023-11-22 DIAGNOSIS — Z1231 Encounter for screening mammogram for malignant neoplasm of breast: Secondary | ICD-10-CM | POA: Diagnosis not present

## 2023-11-22 DIAGNOSIS — E1165 Type 2 diabetes mellitus with hyperglycemia: Secondary | ICD-10-CM | POA: Diagnosis not present

## 2023-11-26 DIAGNOSIS — H5203 Hypermetropia, bilateral: Secondary | ICD-10-CM | POA: Diagnosis not present

## 2023-11-28 DIAGNOSIS — I10 Essential (primary) hypertension: Secondary | ICD-10-CM | POA: Diagnosis not present

## 2023-11-28 DIAGNOSIS — Z0000 Encounter for general adult medical examination without abnormal findings: Secondary | ICD-10-CM | POA: Diagnosis not present

## 2023-11-28 DIAGNOSIS — E1165 Type 2 diabetes mellitus with hyperglycemia: Secondary | ICD-10-CM | POA: Diagnosis not present

## 2023-11-28 DIAGNOSIS — E785 Hyperlipidemia, unspecified: Secondary | ICD-10-CM | POA: Diagnosis not present

## 2023-11-28 DIAGNOSIS — Z23 Encounter for immunization: Secondary | ICD-10-CM | POA: Diagnosis not present

## 2023-12-12 DIAGNOSIS — Z794 Long term (current) use of insulin: Secondary | ICD-10-CM | POA: Diagnosis not present

## 2023-12-12 DIAGNOSIS — E785 Hyperlipidemia, unspecified: Secondary | ICD-10-CM | POA: Diagnosis not present

## 2023-12-12 DIAGNOSIS — R000 Tachycardia, unspecified: Secondary | ICD-10-CM | POA: Diagnosis not present

## 2023-12-12 DIAGNOSIS — E119 Type 2 diabetes mellitus without complications: Secondary | ICD-10-CM | POA: Diagnosis not present

## 2023-12-12 DIAGNOSIS — I10 Essential (primary) hypertension: Secondary | ICD-10-CM | POA: Diagnosis not present

## 2024-01-04 DIAGNOSIS — E119 Type 2 diabetes mellitus without complications: Secondary | ICD-10-CM | POA: Diagnosis not present
